# Patient Record
Sex: Male | Born: 1955 | Race: White | Hispanic: No | Smoking: Never smoker
Health system: Southern US, Community
[De-identification: ages and names within clinical notes are randomized; demographics above are authoritative.]

## PROBLEM LIST (undated history)

## (undated) DIAGNOSIS — E785 Hyperlipidemia, unspecified: Secondary | ICD-10-CM

## (undated) HISTORY — DX: Hyperlipidemia, unspecified: E78.5

---

## 2003-04-01 ENCOUNTER — Encounter: Admission: RE | Admit: 2003-04-01 | Discharge: 2003-04-01 | Payer: Self-pay | Admitting: Occupational Medicine

## 2003-04-01 ENCOUNTER — Encounter: Payer: Self-pay | Admitting: Occupational Medicine

## 2003-08-08 ENCOUNTER — Ambulatory Visit (HOSPITAL_BASED_OUTPATIENT_CLINIC_OR_DEPARTMENT_OTHER): Admission: RE | Admit: 2003-08-08 | Discharge: 2003-08-09 | Payer: Self-pay | Admitting: Orthopedic Surgery

## 2004-11-30 ENCOUNTER — Emergency Department (HOSPITAL_COMMUNITY): Admission: EM | Admit: 2004-11-30 | Discharge: 2004-11-30 | Payer: Self-pay | Admitting: Emergency Medicine

## 2006-06-10 ENCOUNTER — Encounter: Payer: Self-pay | Admitting: Internal Medicine

## 2006-06-10 ENCOUNTER — Encounter: Admission: RE | Admit: 2006-06-10 | Discharge: 2006-06-10 | Payer: Self-pay | Admitting: Occupational Medicine

## 2006-12-10 HISTORY — PX: COLONOSCOPY: SHX174

## 2007-09-04 ENCOUNTER — Encounter: Admission: RE | Admit: 2007-09-04 | Discharge: 2007-09-04 | Payer: Self-pay | Admitting: Occupational Medicine

## 2007-12-10 ENCOUNTER — Ambulatory Visit: Payer: Self-pay | Admitting: Internal Medicine

## 2007-12-10 DIAGNOSIS — J069 Acute upper respiratory infection, unspecified: Secondary | ICD-10-CM | POA: Insufficient documentation

## 2008-03-03 ENCOUNTER — Ambulatory Visit: Payer: Self-pay | Admitting: Internal Medicine

## 2008-03-03 LAB — CONVERTED CEMR LAB
ALT: 19 units/L (ref 0–53)
AST: 16 units/L (ref 0–37)
Albumin: 3.9 g/dL (ref 3.5–5.2)
Alkaline Phosphatase: 52 units/L (ref 39–117)
BUN: 24 mg/dL — ABNORMAL HIGH (ref 6–23)
CO2: 29 meq/L (ref 19–32)
Chloride: 106 meq/L (ref 96–112)
Eosinophils Relative: 2.8 % (ref 0.0–5.0)
GFR calc non Af Amer: 83 mL/min
Glucose, Bld: 94 mg/dL (ref 70–99)
HDL: 37.9 mg/dL — ABNORMAL LOW (ref >39.0)
Hemoglobin, Urine: NEGATIVE
Lymphocytes Relative: 28.2 % (ref 12.0–46.0)
Monocytes Relative: 10.8 % (ref 3.0–12.0)
Neutrophils Relative %: 57.7 % (ref 43.0–77.0)
Nitrite: NEGATIVE
Platelets: 224 10*3/uL (ref 150–400)
Potassium: 4.6 meq/L (ref 3.5–5.1)
RBC: 4.89 M/uL (ref 4.22–5.81)
Specific Gravity, Urine: >=1.03 (ref 1.000–1.03)
Total Protein: 6.6 g/dL (ref 6.0–8.3)
Urobilinogen, UA: 0.2 (ref 0.0–1.0)
VLDL: 14 mg/dL (ref 0–40)
WBC: 5.6 10*3/uL (ref 4.5–10.5)

## 2008-03-11 ENCOUNTER — Ambulatory Visit: Payer: Self-pay | Admitting: Internal Medicine

## 2008-03-11 DIAGNOSIS — L255 Unspecified contact dermatitis due to plants, except food: Secondary | ICD-10-CM | POA: Insufficient documentation

## 2008-03-11 DIAGNOSIS — E785 Hyperlipidemia, unspecified: Secondary | ICD-10-CM | POA: Insufficient documentation

## 2008-04-11 ENCOUNTER — Ambulatory Visit: Payer: Self-pay | Admitting: Internal Medicine

## 2008-04-11 LAB — CONVERTED CEMR LAB
Cholesterol: 199 mg/dL (ref 0–200)
LDL Cholesterol: 140 mg/dL — ABNORMAL HIGH (ref 0–99)
VLDL: 23 mg/dL (ref 0–40)

## 2008-09-13 ENCOUNTER — Ambulatory Visit: Payer: Self-pay | Admitting: Internal Medicine

## 2008-09-13 DIAGNOSIS — R1032 Left lower quadrant pain: Secondary | ICD-10-CM | POA: Insufficient documentation

## 2008-09-14 ENCOUNTER — Encounter: Payer: Self-pay | Admitting: Internal Medicine

## 2008-09-14 LAB — CONVERTED CEMR LAB
AST: 21 units/L (ref 0–37)
Alkaline Phosphatase: 48 units/L (ref 39–117)
Basophils Absolute: 0.1 10*3/uL (ref 0.0–0.1)
Chloride: 104 meq/L (ref 96–112)
Eosinophils Absolute: 0.1 10*3/uL (ref 0.0–0.7)
GFR calc non Af Amer: 83 mL/min
Hemoglobin, Urine: NEGATIVE
MCHC: 34.6 g/dL (ref 30.0–36.0)
MCV: 95.7 fL (ref 78.0–100.0)
Neutrophils Relative %: 60.5 % (ref 43.0–77.0)
Platelets: 210 10*3/uL (ref 150–400)
Potassium: 4.2 meq/L (ref 3.5–5.1)
Total Bilirubin: 0.5 mg/dL (ref 0.3–1.2)
Urine Glucose: NEGATIVE mg/dL
Urobilinogen, UA: 0.2 (ref 0.0–1.0)
WBC: 6.7 10*3/uL (ref 4.5–10.5)

## 2008-11-07 ENCOUNTER — Ambulatory Visit: Payer: Self-pay | Admitting: Internal Medicine

## 2009-03-03 ENCOUNTER — Encounter: Payer: Self-pay | Admitting: Internal Medicine

## 2009-03-03 ENCOUNTER — Telehealth: Payer: Self-pay | Admitting: Internal Medicine

## 2009-03-07 ENCOUNTER — Ambulatory Visit: Payer: Self-pay | Admitting: Internal Medicine

## 2009-04-09 ENCOUNTER — Telehealth: Payer: Self-pay | Admitting: Family Medicine

## 2009-04-10 ENCOUNTER — Telehealth: Payer: Self-pay | Admitting: Internal Medicine

## 2010-08-30 ENCOUNTER — Ambulatory Visit: Payer: Self-pay | Admitting: Internal Medicine

## 2010-12-25 NOTE — Assessment & Plan Note (Signed)
Summary: cold/congestion/cough-lb   Vital Signs:  Patient profile:   55 year old male Height:      71 inches Weight:      197 pounds BMI:     27.58 O2 Sat:      98 % on Room air Temp:     96.7 degrees F oral Pulse rate:   72 / minute BP sitting:   112 / 80  (left arm) Cuff size:   large  Vitals Entered By: Bill Salinas CMA (August 30, 2010 9:13 AM)  O2 Flow:  Room air CC: pt here with c/o weakness, fatigue, 1 episode of vomitting,  and diarrhea x 2 days/ ab   Primary Care Provider:  Baley Shands  CC:  pt here with c/o weakness, fatigue, 1 episode of vomitting, and and diarrhea x 2 days/ ab.  History of Present Illness: Patient present with a two day illness that began with N/V, pressure in the head, cough that is non-productive, no SOB, no fever. Having loose stools, twice yesterday. Had some muscle pain. No tinnitis or hearing loss. No chest pain or pressure. No arthralgias.   Current Medications (verified): 1)  Lovastatin 40 Mg  Tabs (Lovastatin) .Marland Kitchen.. 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    Physical Exam  General:  Well-developed,well-nourished,in no acute distress; alert,appropriate and cooperative throughout examination Head:  normocephalic and atraumatic.  No sinus tenderness to percusssion.  Eyes:  PERRLA, C&S clear Ears:  TMs normal Mouth:  no oral lesions, throat clear Neck:  supple and no masses.   Lungs:  normal respiratory effort, normal breath sounds, no crackles, and no wheezes.   Heart:  normal rate and regular rhythm.   Abdomen:  soft, non-tender, and normal bowel sounds.   Msk:  no joint tenderness, no joint swelling, and no joint warmth.   Pulses:  2+ radial Neurologic:  alert & oriented X3 and cranial nerves II-XII intact.  abnormal gait with abnormal movement right leg. Skin:  turgor normal, color normal, and no rashes.   Cervical Nodes:  no anterior cervical adenopathy and no posterior cervical adenopathy.   Psych:  normally interactive and good eye contact.     Impression & Recommendations:  Problem # 1:  VIRAL INFECTION (ICD-079.99) Patient with non-focal exam, no evidence of a bacterial infection.  Plan - supportive care: APAP, hydrate, Vit C, ecchinacea            written out of work to Monday, Oct 10th  Complete Medication List: 1)  Lovastatin 40 Mg Tabs (Lovastatin) .Marland Kitchen.. 1 by mouth qpm   Preventive Care Screening  Last  Tetanus Booster:    Date:  01/27/2008    Results:  Historical

## 2011-04-12 NOTE — Op Note (Signed)
NAME:  Leonard Soto, Leonard Soto                            ACCOUNT NO.:  [id number]   MEDICAL RECORD NO.:  [id number]                   PATIENT TYPE:  AMB   LOCATION:  [facility]SC                                  FACILITY:  [facility]   PHYSICIAN:  Leonides Grills, M.D.                  DATE OF BIRTH:  05/08/1956   DATE OF PROCEDURE:  08/08/2003  DATE OF DISCHARGE:                                 OPERATIVE REPORT   PREOPERATIVE DIAGNOSIS:  1. Right ankle arthritis.  2. Right posterior ankle contracture.  3. Right tight Achilles tendon.  4. Malposition valgus hindfoot.   POSTOPERATIVE DIAGNOSIS:  1. Right ankle arthritis.  2. Right posterior ankle contracture.  3. Right tight Achilles tendon.  4. Malposition valgus hind food.   OPERATION PERFORMED:  1. Right ankle fusion.  2. Right local bone graft.  3. Right posterior ankle capsular release.  4. Right percutaneous tendo-Achilles lengthening.  5. Right medializing calcaneal osteotomy.  6. Right stress x-rays of ankle.   SURGEON:  Leonides Grills, M.D.   ASSISTANT:  Lianne Cure, P.A.   ANESTHESIA:  General endotracheal tube.   ESTIMATED BLOOD LOSS:  Minimal.   TOURNIQUET TIME:  Approximately two hours.   COMPLICATIONS:  None.   DISPOSITION:  Stable to PR.   INDICATIONS FOR PROCEDURE:  The patient is a 55 year old male who had a foot  drop and was noncompliant with his brace and developed a contracture,  equinus valgus contracture interestingly.  He also had pain on the anterior  aspect of his ankle as well as his forefoot.  He had the option of either a  soft tissue release and wearing his brace or an ankle fusion at this point.  He did not have any adequate musculature for transfer.  He elected to have  the ankle fusion for by his own admission, he would not be compliant with  the brace.  He was consented for the above procedure.  All risks which  include infection, neurovascular injury, nonunion, malunion, hardware  irritation, hardware  failure, persistent pain, worsening pain, stiffness,  arthritis were all explained, questions were encouraged and answered.   DESCRIPTION OF PROCEDURE:  The patient was brought to the operating room and  placed in supine position initially after adequate general endotracheal tube  anesthesia was administered as well as Ancef 1g IV piggyback.  A bump was  placed under the right ipsilateral hip and the right lower extremity was  prepped and draped in a sterile manner over a proximally placed thigh  tourniquet.  The limb was gravity exsanguinated and the tourniquet was  elevated to 290 mmHg after two medial and one lateral hemisections of the  Achilles was then performed.  This had somewhat good release of the tight  Achilles tendon.  We then started the procedure with a longitudinal incision  over the lateral malleolus.  Dissection was carried down  directly to bone.  Soft tissues were elevated anteriorly.  Approximately 5 cm proximal to the  tip of the lateral malleolus, a 1 cm block of bone was then removed from the  fibula doing a fibular osteotomy.  The syndesmotic ligament was then incised  and completely released and the fibula was then externally rotated.  The  remaining cartilage was then removed with a curved quarter inch osteotome,  curet involving the ankle.  This was done meticulously.  Then multiple 2 mm  drill holes were then placed on either side of the joint as well as  laterally along the tibia as well.  Then with a sagittal saw, the medial  third of the fibula was then removed in the sagittal plane.  This was then  used for local bone graft.  After this was done, we then released the  posterior capsule with a curved quarter inch osteotome as well as a scalpel  by visualizing this with a lamina spreader stretching the capsule and  cutting this under tension.  We then dorsiflexed and posteriorly translated  the foot within the ankle mortise and provisionally fixed this with 2.0  mm K-  wires.  This was visualized on stress x-ray under a C-arm guidance in the AP  and lateral plane.  The foot was in excellent alignment as well anatomically  with proper external rotation as well.  The hind foot was still in valgus,  but there was no erosion within the plafond to try to correct the valgus as  well at this point.  It was elected that we would do the medializing  calcaneal osteotomy as well.  The ankle was then fixed with two laterally  and medially placed 6.5 mm 16 mm threaded cancellous screws with the medial  one done through a percutaneous hole with a nick and spread technique.  This  was visualized under C-arm guidance in the AP and lateral planes to be in a  proper position.  The wound was copiously irrigated with normal saline.  A 2  point reduction clamp was used to reduce the biological plate laterally and  then two 6.5 mm 32 mm threaded cancellous screws were then placed in a lag  fashion.  This had excellent compression across the fusion site.  Local bone  graft that was used previously was then packed into the area with the bur,  stress strain relieving bone graft was placed as well.  X-rays were obtained  in three views, AP, lateral and mortise view showed excellent alignment and  placement of fixation.  We then made a longitudinal incision over the  lateral aspect of the calcaneus.  Dissection was carried down to bone.  Soft  tissues were elevated both superiorly and inferiorly and then with a 4000  saw, an osteotomy was then made perpendicular to the lateral wall of the  calcaneus. The calcaneus was then translated about 5 mm medially and then  provisionally fixed with a 2.0 mm K-wire.  A longitudinal incision was then  made midline, the sagittal plane over the heel at the __________ skin  border.  Dissection was carried down to bone.  Two 6.5 mm 16 mm threaded cancellous screws were then placed using 4.5 3.5 mm drill holes  respectively.  This had  excellent compression of the osteotomy and  maintenance of the correction.  The wounds were then copiously irrigated  with normal saline.  Tourniquet was deflated.  Hemostasis was obtained.  The  wound was closed  with 3-0 Vicryl and 4-0 nylon respectively.  Sterile  dressing was applied.  Modified Jones dressing was applied.  The patient was  stable to the PR.                                                   Leonides Grills, M.D.    PB/MEDQ  D:  08/09/2003  T:  08/09/2003  Job:  [id number]

## 2012-08-18 ENCOUNTER — Ambulatory Visit (INDEPENDENT_AMBULATORY_CARE_PROVIDER_SITE_OTHER): Payer: BC Managed Care – PPO | Admitting: Internal Medicine

## 2012-08-18 ENCOUNTER — Encounter: Payer: Self-pay | Admitting: Internal Medicine

## 2012-08-18 ENCOUNTER — Ambulatory Visit (INDEPENDENT_AMBULATORY_CARE_PROVIDER_SITE_OTHER)
Admission: RE | Admit: 2012-08-18 | Discharge: 2012-08-18 | Disposition: A | Discharge status: Home / Self Care | Payer: BC Managed Care – PPO | Source: Ambulatory Visit | Attending: Internal Medicine | Admitting: Internal Medicine

## 2012-08-18 ENCOUNTER — Telehealth: Payer: Self-pay | Admitting: Internal Medicine

## 2012-08-18 VITALS — BP 122/80 | HR 68 | Temp 97.3°F | Resp 16 | Wt 197.0 lb

## 2012-08-18 DIAGNOSIS — M545 Low back pain, unspecified: Secondary | ICD-10-CM

## 2012-08-18 MED ORDER — CYCLOBENZAPRINE HCL 10 MG PO TABS: 10.0000 mg | ORAL_TABLET | Freq: Three times a day (TID) | ORAL | Status: DC

## 2012-08-18 NOTE — Progress Notes (Signed)
  Subjective:    Patient ID: Leonard Soto, male    DOB: [birth year], 56 y.o.   MRN: [id number]  HPI Leonard Soto presents for evaluation after a fall at work. His left leg gave away and he fell. He did not strike anyting and he did not pass out. He developed right of center back pain. Has had minor paresthesia right leg, no loss of strength. No problem with bowel or bladder control. Able to stand and walk as his usual - uses a cane.        Current Outpatient Prescriptions on File Prior to Visit  Medication Sig Dispense Refill  . lovastatin (MEVACOR) 40 MG tablet Take 40 mg by mouth at bedtime.          Review of Systems System review is negative for any constitutional, cardiac, pulmonary, GI or neuro symptoms or complaints other than as described in the HPI.     Objective:   Physical Exam Filed Vitals:   08/18/12 1110  BP: 122/80  Pulse: 68  Temp: 97.3 F (36.3 C)  Resp: 16   Gen'l - WNWD white male in no distress HEENT- C&S clear - PERRLA Cor - RRR Pulm - clear to A&P Back - Back exam: limited stand due to chronic leg issues; normal flex to greater than 70 degrees; abnormal gait - chronic; cannot  toe/heel walk; abnormal step up to exam table but able to get up w/o assistance; normal SLR sitting; normal DTRs at the patellar tendons;  no  CVA tenderness..        Assessment & Plan:  Back strain - no evidence of radiculopathy. Suspect muscle strain from torquing with fall.  Plan  L-S spine films  Lineament of choice  Heat patch  No work or strain for 3-5 days.

## 2012-08-18 NOTE — Telephone Encounter (Signed)
Caller: Wilburt/Patient; Patient Name: Leonard Soto; PCP: Illene Regulus (Adults only); Best Callback Phone Number: [phone]. Emmette calling for an appointment with PCP this am for back pain. Caller reports he was sick yesterday and had some coughing and then developed back pain. Onset of symptoms, Monday 08/17/12. Caller has history of back pain. Declined triage, requesting appointment. Emergent symptoms ruled out per Back  Symptoms Protocol. Appointment scheduled for today, Tues 9/24 at 11:00 with Dr Debby Bud at [place] location. Caller is agreeable.

## 2012-08-18 NOTE — Patient Instructions (Addendum)
Back strain - no evidence of radiculopathy. Suspect muscle strain from torquing with fall.  Plan  L-S spine films  Lineament of choice  Heat patch  No work or strain for 3-5 days.   Back Pain, Adult Low back pain is very common. About 1 in 5 people have back pain. The cause of low back pain is rarely dangerous. The pain often gets better over time. About half of people with a sudden onset of back pain feel better in just 2 weeks. About 8 in 10 people feel better by 6 weeks.   CAUSES Some common causes of back pain include:  Strain of the muscles or ligaments supporting the spine.   Wear and tear (degeneration) of the spinal discs.   Arthritis.   Direct injury to the back.  DIAGNOSIS Most of the time, the direct cause of low back pain is not known. However, back pain can be treated effectively even when the exact cause of the pain is unknown. Answering your caregiver's questions about your overall health and symptoms is one of the most accurate ways to make sure the cause of your pain is not dangerous. If your caregiver needs more information, he or she may order lab work or imaging tests (X-rays or MRIs). However, even if imaging tests show changes in your back, this usually does not require surgery. HOME CARE INSTRUCTIONS For many people, back pain returns. Since low back pain is rarely dangerous, it is often a condition that people can learn to manage on their own.    Remain active. It is stressful on the back to sit or stand in one place. Do not sit, drive, or stand in one place for more than 30 minutes at a time. Take short walks on level surfaces as soon as pain allows. Try to increase the length of time you walk each day.   Do not stay in bed. Resting more than 1 or 2 days can delay your recovery.   Do not avoid exercise or work. Your body is made to move. It is not dangerous to be active, even though your back may hurt. Your back will likely heal faster if you return to being  active before your pain is gone.   Pay attention to your body when you  bend and lift. Many people have less discomfort when lifting if they bend their knees, keep the load close to their bodies, and avoid twisting. Often, the most comfortable positions are those that put less stress on your recovering back.   Find a comfortable position to sleep. Use a firm mattress and lie on your side with your knees slightly bent. If you lie on your back, put a pillow under your knees.   Only take over-the-counter or prescription medicines as directed by your caregiver. Over-the-counter medicines to reduce pain and inflammation are often the most helpful. Your caregiver may prescribe muscle relaxant drugs. These medicines help dull your pain so you can more quickly return to your normal activities and healthy exercise.   Put ice on the injured area.   Put ice in a plastic bag.   Place a towel between your skin and the bag.   Leave the ice on for 15 to 20 minutes, 3 to 4 times a day for the first 2 to 3 days. After that, ice and heat may be alternated to reduce pain and spasms.   Ask your caregiver about trying back exercises and gentle massage. This may be of some benefit.  Avoid feeling anxious or stressed. Stress increases muscle tension and can worsen back pain. It is important to recognize when you are anxious or stressed and learn ways to manage it. Exercise is a great option.  SEEK MEDICAL CARE IF:  You have pain that is not relieved with rest or medicine.   You have pain that does not improve in 1 week.   You have new symptoms.   You are generally not feeling well.  SEEK IMMEDIATE MEDICAL CARE IF:    You have pain that radiates from your back into your legs.   You develop new bowel or bladder control problems.   You have unusual weakness or numbness in your arms or legs.   You develop nausea or vomiting.   You develop abdominal pain.   You feel faint.  Document Released:  11/11/2005 Document Revised: 10/31/2011 Document Reviewed: 04/01/2011 [facility] Patient Information 2012 [place], [place].

## 2012-11-02 ENCOUNTER — Telehealth: Payer: Self-pay | Admitting: Internal Medicine

## 2012-11-02 NOTE — Telephone Encounter (Signed)
I apologize for the delay with this msg....  Called patient, spoke with his wife.  She is going to have the patient call at his earliest convienence to schedule his appointment.  If the call is not returned, I will call pt back.

## 2012-11-02 NOTE — Telephone Encounter (Signed)
Message copied by Newell Coral on Mon Nov 02, 2012 10:47 AM ------      Message from: Illene Regulus E      Created: Mon Oct 26, 2012  9:24 AM       Needs CPX in the next 8-12 weeks. Received outside labs and will need to discuss at his CPX

## 2013-01-09 ENCOUNTER — Other Ambulatory Visit: Payer: Self-pay

## 2013-09-30 ENCOUNTER — Other Ambulatory Visit: Payer: Self-pay

## 2013-12-16 ENCOUNTER — Ambulatory Visit (INDEPENDENT_AMBULATORY_CARE_PROVIDER_SITE_OTHER): Payer: BC Managed Care – PPO | Admitting: Internal Medicine

## 2013-12-16 ENCOUNTER — Encounter: Payer: Self-pay | Admitting: Internal Medicine

## 2013-12-16 VITALS — BP 142/104 | HR 65 | Temp 98.0°F | Wt 193.0 lb

## 2013-12-16 DIAGNOSIS — H10029 Other mucopurulent conjunctivitis, unspecified eye: Secondary | ICD-10-CM

## 2013-12-16 DIAGNOSIS — B029 Zoster without complications: Secondary | ICD-10-CM

## 2013-12-16 MED ORDER — SULFACETAMIDE SODIUM 10 % OP SOLN: 1.0000 [drp] | OPHTHALMIC | Status: DC

## 2013-12-16 MED ORDER — PREDNISONE 20 MG PO TABS: 20.0000 mg | ORAL_TABLET | Freq: Every day | ORAL | Status: DC

## 2013-12-16 MED ORDER — VALACYCLOVIR HCL 1 G PO TABS: 1000.0000 mg | ORAL_TABLET | Freq: Three times a day (TID) | ORAL | Status: DC

## 2013-12-16 NOTE — Progress Notes (Signed)
   Subjective:    Patient ID: Leonard Soto, male    DOB: [birth year], 58 y.o.   MRN: [id number]  HPI Monday he awoke with swollen face, couldn't breath, very painful jaw and swelling around the eye, He went to the dentist - had to stand out in the cold for a while waiting for the office to open. He had the left upper premolar pulled and he was started on amoxicillin and pain medication. Tuesday morning he awoke sick - N/V. Saw the dentist and was told to stop taking the hydrocodone. This AM he went to work and noted that he had blurred vision out of the left eye. He saw the company nurse - he had a rash on the left forehead/temple diagnosed as zoster and question of ophthalmologic involvement.  Past Medical History  Diagnosis Date  . Hyperlipidemia    Past Surgical History  Procedure Laterality Date  . Colonoscopy  12/10/2006   Family History  Problem Relation Age of Onset  . Hypertension Mother   . COPD Mother   . CVA Mother   . Hypertension Father   . Hyperlipidemia Father   . Benign prostatic hyperplasia Father    History   Social History  . Marital Status: Married    Spouse Name: N/A    Number of Children: N/A  . Years of Education: N/A   Occupational History  . Not on file.   Social History Main Topics  . Smoking status: Never Smoker   . Smokeless tobacco: Not on file  . Alcohol Use: No  . Drug Use: No  . Sexual Activity: Not on file   Other Topics Concern  . 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   No current facility-administered medications on file prior to visit.      Review of Systems System review is negative for any constitutional, cardiac, pulmonary, GI or neuro symptoms or complaints other than  as described in the HPI.    Objective:   Physical Exam Filed Vitals:   12/16/13 1602  BP: 142/104  Pulse: 65  Temp: 98 F (36.7 C)   gen'l - WNWD man in no distress HEENT- - bulbar conjunctiva appear injected. No papules near the eye or on the conjunctiva. Lens appears clear. Pupil is round and reactive. Vision normal Nodes - normal Cor - RRR Pulm - CTAP        Assessment & Plan:  1. Zoster - forehead., No apparent involvement of the eye. Plan Valtrex 1g tid x 7  Prednisone 20 daily x 10 days  2. Pink eye - he has had thick exudate and erythema. Plan Sulfacetamide ophthalmic sol 2 drops 5 times a day for 7 days.  Alarm symptoms - loss of vision, pain in the eye - will need to see eye Dr.   3. Dental - finish the amoxicillin.

## 2013-12-16 NOTE — Progress Notes (Signed)
Pre visit review using our clinic review tool, if applicable. No additional management support is needed unless otherwise documented below in the visit note. 

## 2013-12-16 NOTE — Patient Instructions (Signed)
1. Zoster - forehead., No apparent involvement of the eye. Plan Valtrex 1g tid x 7  Prednisone 20 daily x 10 days  2. Pink eye - he has had thick exudate and erythema. Plan Sulfacetamide ophthalmic sol 2 drops 5 times a day for 7 days.  Alarm symptoms - loss of vision, pain in the eye - will need to see eye Dr.   3. Dental - finish the amoxicillin.  Shingles Shingles (herpes zoster) is an infection that is caused by the same virus that causes chickenpox (varicella). The infection causes a painful skin rash and fluid-filled blisters, which eventually break open, crust over, and heal. It may occur in any area of the body, but it usually affects only one side of the body or face. The pain of shingles usually lasts about 1 month. However, some people with shingles may develop long-term (chronic) pain in the affected area of the body. Shingles often occurs many years after the person had chickenpox. It is more common:  In people older than 50 years.  In people with weakened immune systems, such as those with HIV, AIDS, or cancer.  In people taking medicines that weaken the immune system, such as transplant medicines.  In people under great stress. CAUSES  Shingles is caused by the varicella zoster virus (VZV), which also causes chickenpox. After a person is infected with the virus, it can remain in the person's body for years in an inactive state (dormant). To cause shingles, the virus reactivates and breaks out as an infection in a nerve root. The virus can be spread from person to person (contagious) through contact with open blisters of the shingles rash. It will only spread to people who have not had chickenpox. When these people are exposed to the virus, they may develop chickenpox. They will not develop shingles. Once the blisters scab over, the person is no longer contagious and cannot spread the virus to others. SYMPTOMS  Shingles shows up in stages. The initial symptoms may be pain,  itching, and tingling in an area of the skin. This pain is usually described as burning, stabbing, or throbbing.In a few days or weeks, a painful red rash will appear in the area where the pain, itching, and tingling were felt. The rash is usually on one side of the body in a band or belt-like pattern. Then, the rash usually turns into fluid-filled blisters. They will scab over and dry up in approximately 2 3 weeks. Flu-like symptoms may also occur with the initial symptoms, the rash, or the blisters. These may include:  Fever.  Chills.  Headache.  Upset stomach. DIAGNOSIS  Your caregiver will perform a skin exam to diagnose shingles. Skin scrapings or fluid samples may also be taken from the blisters. This sample will be examined under a microscope or sent to a lab for further testing. TREATMENT  There is no specific cure for shingles. Your caregiver will likely prescribe medicines to help you manage the pain, recover faster, and avoid long-term problems. This may include antiviral drugs, anti-inflammatory drugs, and pain medicines. HOME CARE INSTRUCTIONS   Take a cool bath or apply cool compresses to the area of the rash or blisters as directed. This may help with the pain and itching.   Only take over-the-counter or prescription medicines as directed by your caregiver.   Rest as directed by your caregiver.  Keep your rash and blisters clean with mild soap and cool water or as directed by your caregiver.  Do not  pick your blisters or scratch your rash. Apply an anti-itch cream or numbing creams to the affected area as directed by your caregiver.  Keep your shingles rash covered with a loose bandage (dressing).  Avoid skin contact with:  Babies.   Pregnant women.   Children with eczema.   Elderly people with transplants.   People with chronic illnesses, such as leukemia or AIDS.   Wear loose-fitting clothing to help ease the pain of material rubbing against the  rash.  Keep all follow-up appointments with your caregiver.If the area involved is on your face, you may receive a referral for follow-up to a specialist, such as an eye doctor (ophthalmologist) or an ear, nose, and throat (ENT) doctor. Keeping all follow-up appointments will help you avoid eye complications, chronic pain, or disability.  SEEK IMMEDIATE MEDICAL CARE IF:   You have facial pain, pain around the eye area, or loss of feeling on one side of your face.  You have ear pain or ringing in your ear.  You have loss of taste.  Your pain is not relieved with prescribed medicines.   Your redness or swelling spreads.   You have more pain and swelling.  Your condition is worsening or has changed.   You have a feveror persistent symptoms for more than 2 3 days.  You have a fever and your symptoms suddenly get worse. MAKE SURE YOU:  Understand these instructions.  Will watch your condition.  Will get help right away if you are not doing well or get worse. Document Released: 11/11/2005 Document Revised: 08/05/2012 Document Reviewed: 06/25/2012 [facility] Patient Information 2014 [place], [place].    Bacterial Conjunctivitis Bacterial conjunctivitis, commonly called pink eye, is an inflammation of the clear membrane that covers the white part of the eye (conjunctiva). The inflammation can also happen on the underside of the eyelids. The blood vessels in the conjunctiva become inflamed causing the eye to become red or pink. Bacterial conjunctivitis may spread easily from one eye to another and from person to person (contagious).  CAUSES  Bacterial conjunctivitis is caused by bacteria. The bacteria may come from your own skin, your upper respiratory tract, or from someone else with bacterial conjunctivitis. SYMPTOMS  The normally white color of the eye or the underside of the eyelid is usually pink or red. The pink eye is usually associated with irritation, tearing, and some  sensitivity to light. Bacterial conjunctivitis is often associated with a thick, yellowish discharge from the eye. The discharge may turn into a crust on the eyelids overnight, which causes your eyelids to stick together. If a discharge is present, there may also be some blurred vision in the affected eye. DIAGNOSIS  Bacterial conjunctivitis is diagnosed by your caregiver through an eye exam and the symptoms that you report. Your caregiver looks for changes in the surface tissues of your eyes, which may point to the specific type of conjunctivitis. A sample of any discharge may be collected on a cotton-tip swab if you have a severe case of conjunctivitis, if your cornea is affected, or if you keep getting repeat infections that do not respond to treatment. The sample will be sent to a lab to see if the inflammation is caused by a bacterial infection and to see if the infection will respond to antibiotic medicines. TREATMENT   Bacterial conjunctivitis is treated with antibiotics. Antibiotic eyedrops are most often used. However, antibiotic ointments are also available. Antibiotics pills are sometimes used. Artificial tears or eye washes may ease  discomfort. HOME CARE INSTRUCTIONS   To ease discomfort, apply a cool, clean wash cloth to your eye for 10 20 minutes, 3 4 times a day.  Gently wipe away any drainage from your eye with a warm, wet washcloth or a cotton ball.  Wash your hands often with soap and water. Use paper towels to dry your hands.  Do not share towels or wash cloths. This may spread the infection.  Change or wash your pillow case every day.  You should not use eye makeup until the infection is gone.  Do not operate machinery or drive if your vision is blurred.  Stop using contacts lenses. Ask your caregiver how to sterilize or replace your contacts before using them again. This depends on the type of contact lenses that you use.  When applying medicine to the infected eye, do  not touch the edge of your eyelid with the eyedrop bottle or ointment tube. SEEK IMMEDIATE MEDICAL CARE IF:   Your infection has not improved within 3 days after beginning treatment.  You had yellow discharge from your eye and it returns.  You have increased eye pain.  Your eye redness is spreading.  Your vision becomes blurred.  You have a fever or persistent symptoms for more than 2 3 days.  You have a fever and your symptoms suddenly get worse.  You have facial pain, redness, or swelling. MAKE SURE YOU:   Understand these instructions.  Will watch your condition.  Will get help right away if you are not doing well or get worse. Document Released: 11/11/2005 Document Revised: 08/05/2012 Document Reviewed: 04/13/2012 [facility] Patient Information 2014 [place].

## 2014-01-07 ENCOUNTER — Encounter (INDEPENDENT_AMBULATORY_CARE_PROVIDER_SITE_OTHER): Payer: BC Managed Care – PPO | Admitting: Ophthalmology

## 2014-01-07 DIAGNOSIS — H538 Other visual disturbances: Secondary | ICD-10-CM

## 2014-01-07 DIAGNOSIS — H1820 Unspecified corneal edema: Secondary | ICD-10-CM

## 2014-01-07 DIAGNOSIS — H43819 Vitreous degeneration, unspecified eye: Secondary | ICD-10-CM

## 2014-06-13 ENCOUNTER — Telehealth: Payer: Self-pay | Admitting: Internal Medicine

## 2014-06-13 NOTE — Telephone Encounter (Signed)
Ok with me 

## 2014-06-13 NOTE — Telephone Encounter (Signed)
Pt call back and stated that he going to stay with Dr. Dorise HissKollar. Cancel this request

## 2014-06-13 NOTE — Telephone Encounter (Signed)
[facility] patient would like to see if Dr. Jonny RuizJohn would be able to take him on.

## 2014-08-22 ENCOUNTER — Other Ambulatory Visit (INDEPENDENT_AMBULATORY_CARE_PROVIDER_SITE_OTHER): Payer: BC Managed Care – PPO

## 2014-08-22 ENCOUNTER — Ambulatory Visit (INDEPENDENT_AMBULATORY_CARE_PROVIDER_SITE_OTHER): Payer: BC Managed Care – PPO | Admitting: Internal Medicine

## 2014-08-22 ENCOUNTER — Encounter: Payer: Self-pay | Admitting: Internal Medicine

## 2014-08-22 VITALS — BP 140/84 | HR 59 | Temp 97.8°F | Resp 18 | Ht 71.0 in | Wt 194.0 lb

## 2014-08-22 DIAGNOSIS — Z0000 Encounter for general adult medical examination without abnormal findings: Secondary | ICD-10-CM

## 2014-08-22 DIAGNOSIS — Z23 Encounter for immunization: Secondary | ICD-10-CM

## 2014-08-22 DIAGNOSIS — E669 Obesity, unspecified: Secondary | ICD-10-CM

## 2014-08-22 DIAGNOSIS — E785 Hyperlipidemia, unspecified: Secondary | ICD-10-CM | POA: Diagnosis not present

## 2014-08-22 LAB — BASIC METABOLIC PANEL
BUN: 14 mg/dL (ref 6–23)
CHLORIDE: 103 meq/L (ref 96–112)
CO2: 28 mEq/L (ref 19–32)
Calcium: 9.3 mg/dL (ref 8.4–10.5)
Creatinine, Ser: 1 mg/dL (ref 0.4–1.5)
GFR: 81.98 mL/min (ref >60.00)
Glucose, Bld: 92 mg/dL (ref 70–99)
POTASSIUM: 4 meq/L (ref 3.5–5.1)
SODIUM: 138 meq/L (ref 135–145)

## 2014-08-22 LAB — LIPID PANEL
CHOLESTEROL: 177 mg/dL (ref 0–200)
HDL: 49.6 mg/dL (ref >39.00)
LDL CALC: 104 mg/dL — AB (ref 0–99)
NonHDL: 127.4
TRIGLYCERIDES: 117 mg/dL (ref 0.0–149.0)
Total CHOL/HDL Ratio: 4
VLDL: 23.4 mg/dL (ref 0.0–40.0)

## 2014-08-22 NOTE — Progress Notes (Signed)
Pre visit review using our clinic review tool, if applicable. No additional management support is needed unless otherwise documented below in the visit note. 

## 2014-08-22 NOTE — Patient Instructions (Signed)
We will check your blood work today and call you with the results. We will not change your medicines.  You are doing a great job with the weight loss and keep up the good work.  Exercise to Stay Healthy Exercise helps you become and stay healthy. EXERCISE IDEAS AND TIPS Choose exercises that:  You enjoy.  Fit into your day. You do not need to exercise really hard to be healthy. You can do exercises at a slow or medium level and stay healthy. You can:  Stretch before and after working out.  Try yoga, Pilates, or tai chi.  Lift weights.  Walk fast, swim, jog, run, climb stairs, bicycle, dance, or rollerskate.  Take aerobic classes. Exercises that burn about 150 calories:  Running 1  miles in 15 minutes.  Playing volleyball for 45 to 60 minutes.  Washing and waxing a car for 45 to 60 minutes.  Playing touch football for 45 minutes.  Walking 1  miles in 35 minutes.  Pushing a stroller 1  miles in 30 minutes.  Playing basketball for 30 minutes.  Raking leaves for 30 minutes.  Bicycling 5 miles in 30 minutes.  Walking 2 miles in 30 minutes.  Dancing for 30 minutes.  Shoveling snow for 15 minutes.  Swimming laps for 20 minutes.  Walking up stairs for 15 minutes.  Bicycling 4 miles in 15 minutes.  Gardening for 30 to 45 minutes.  Jumping rope for 15 minutes.  Washing windows or floors for 45 to 60 minutes. Document Released: 12/14/2010 Document Revised: 02/03/2012 Document Reviewed: 12/14/2010 [facility] Patient Information 2015 [place], [place]. This information is not intended to replace advice given to you by your health care provider. Make sure you discuss any questions you have with your health care provider.

## 2014-08-23 DIAGNOSIS — Z0000 Encounter for general adult medical examination without abnormal findings: Secondary | ICD-10-CM | POA: Insufficient documentation

## 2014-08-23 DIAGNOSIS — E669 Obesity, unspecified: Secondary | ICD-10-CM | POA: Insufficient documentation

## 2014-08-23 NOTE — Assessment & Plan Note (Signed)
Flu shot given today. Advised more moderation in eating choices and if he continues to have loose stools to call back.

## 2014-08-23 NOTE — Assessment & Plan Note (Signed)
He is working on weight loss and has been losing some weight. Encouraged him to continue.

## 2014-08-23 NOTE — Assessment & Plan Note (Signed)
Continue lipitor and check lipid panel and BMP.

## 2014-08-23 NOTE — Progress Notes (Signed)
   Subjective:    Patient ID: Leonard Soto, male    DOB: 1956-11-15, 58 y.o.   MRN: [id number]  HPI The patient is a 58 YO man who is coming in today to establish care. He has PMH of hyperlipidemia. He ate a lot of beans yesterday and has been moving his bowels several times today which is more than usual. He took some pepto which has helped to slow things down. Otherwise no complaints.   Review of Systems  Constitutional: Negative for fever, activity change, appetite change and fatigue.  HENT: Negative.   Eyes: Negative.   Respiratory: Negative for cough, chest tightness, shortness of breath and wheezing.   Cardiovascular: Negative for chest pain, palpitations and leg swelling.  Gastrointestinal: Positive for diarrhea. Negative for abdominal pain, constipation and abdominal distention.  Endocrine: Negative.   Genitourinary: Negative.   Musculoskeletal: Negative.   Neurological: Negative.       Objective:   Physical Exam  Vitals reviewed. Constitutional: He is oriented to person, place, and time. He appears well-developed and well-nourished. No distress.  HENT:  Head: Normocephalic and atraumatic.  Eyes: EOM are normal.  Neck: Normal range of motion.  Cardiovascular: Normal rate and regular rhythm.   Pulmonary/Chest: Effort normal and breath sounds normal. No respiratory distress. He has no wheezes. He has no rales.  Abdominal: Soft. Bowel sounds are normal. He exhibits no distension. There is no tenderness. There is no rebound.  Neurological: He is alert and oriented to person, place, and time. Coordination normal.  Skin: Skin is warm and dry.   Filed Vitals:   08/22/14 1458  BP: 140/84  Pulse: 59  Temp: 97.8 F (36.6 C)  TempSrc: Oral  Resp: 18  Height: 5\' 11"  (1.803 m)  Weight: 194 lb (87.998 kg)  SpO2: 97%      Assessment & Plan:

## 2014-10-11 ENCOUNTER — Telehealth: Payer: Self-pay | Admitting: *Deleted

## 2014-10-11 NOTE — Telephone Encounter (Signed)
Left msg on triage wanting to get copy of labs. Called pt back spoke with wife she stated [employer] is wanting  Labs info & weight ect. Inform pt wife will print copy of labs & ov from 08/22/14 for pick-up,,,/lmb

## 2015-02-22 ENCOUNTER — Other Ambulatory Visit: Payer: Self-pay | Admitting: Geriatric Medicine

## 2015-02-22 MED ORDER — ATORVASTATIN CALCIUM 20 MG PO TABS: 20.0000 mg | ORAL_TABLET | Freq: Every day | ORAL | Status: DC

## 2015-02-27 ENCOUNTER — Other Ambulatory Visit: Payer: Self-pay | Admitting: Geriatric Medicine

## 2015-02-27 ENCOUNTER — Telehealth: Payer: Self-pay | Admitting: Internal Medicine

## 2015-02-27 MED ORDER — ATORVASTATIN CALCIUM 20 MG PO TABS: 20.0000 mg | ORAL_TABLET | Freq: Every day | ORAL | Status: DC

## 2015-02-27 NOTE — Telephone Encounter (Signed)
Sent to pharmacy 

## 2015-02-27 NOTE — Telephone Encounter (Signed)
[facility] is waiting for approval from Dr. For atorvastatin (LIPITOR) 20 MG tablet [[id number]

## 2015-03-10 ENCOUNTER — Ambulatory Visit (INDEPENDENT_AMBULATORY_CARE_PROVIDER_SITE_OTHER): Payer: BLUE CROSS/BLUE SHIELD | Admitting: Internal Medicine

## 2015-03-10 ENCOUNTER — Encounter: Payer: Self-pay | Admitting: Internal Medicine

## 2015-03-10 VITALS — BP 126/86 | HR 71 | Temp 98.0°F | Ht 71.0 in | Wt 196.2 lb

## 2015-03-10 DIAGNOSIS — R05 Cough: Secondary | ICD-10-CM | POA: Diagnosis not present

## 2015-03-10 DIAGNOSIS — R5381 Other malaise: Secondary | ICD-10-CM | POA: Diagnosis not present

## 2015-03-10 DIAGNOSIS — R059 Cough, unspecified: Secondary | ICD-10-CM

## 2015-03-10 MED ORDER — HYDROCODONE-HOMATROPINE 5-1.5 MG/5ML PO SYRP: 5.0000 mL | ORAL_SOLUTION | Freq: Three times a day (TID) | ORAL | Status: DC | PRN

## 2015-03-10 MED ORDER — AZITHROMYCIN 250 MG PO TABS: ORAL_TABLET | ORAL | Status: DC

## 2015-03-10 NOTE — Progress Notes (Signed)
Pre visit review using our clinic review tool, if applicable. No additional management support is needed unless otherwise documented below in the visit note. 

## 2015-03-10 NOTE — Progress Notes (Signed)
   Subjective:    Patient ID: Leonard Soto, male    DOB: 1956/09/01, 59 y.o.   MRN: [id number]  HPI  His symptoms began 03/08/15 as malaise associated with some nonproductive cough. On 4/14 his symptoms improved but last night the cough returned. Today he felt mentally "foggy" and continued to have malaise. He has had no other localizing signs or symptoms. He did not feel he should work as his job does require mental alertness. 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  He did have the flu shot  Review of Systems  Frontal headache, facial pain , nasal purulence, dental pain, sore throat , otic pain or otic discharge denied.  Sputum production, hemoptysis, or pleuritic pain denied.  No diarrhea present.Cola colored urine or clay colored stools denied.  Dysuria, pyuria, or hematuria not present.  No new rashes, pustules, vesicles.  No redness or swelling of joints.  No significant travel, pets, or tick exposures.      Objective:   Physical Exam  Pertinent or positive findings include: Pattern alopecia is present.  The nasal septum is deviated to the right.  Loud S4 is present.  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  He has no evidence of meningismus with passive or active neck range of motion and straight leg raising of the lower extremity.  He has scattered tattoos.   General appearance :adequately nourished; in no distress. Eyes: No conjunctival inflammation or scleral icterus is present. Oral exam:  Lips and gums are healthy appearing.There is no oropharyngeal erythema or exudate noted. Dental hygiene is good. Heart:  Normal rate and regular rhythm. S1 and S2 normal without gallop, murmur, click,or rub.   Lungs:Chest clear to auscultation; no wheezes, rhonchi,rales ,or rubs present.No increased work of breathing.  Abdomen: bowel sounds normal, soft and non-tender without masses, organomegaly or hernias noted.  No guarding or rebound.  Vascular : all pulses  equal ; no bruits present. Skin:Warm & dry.  Intact without suspicious lesions or rashes ; no tenting or jaundice  Lymphatic: No lymphadenopathy is noted about the head, neck, axilla Neuro: Strength, tone  normal.        Assessment & Plan:  #1 malaise #2 cough See AVS

## 2015-03-10 NOTE — Patient Instructions (Signed)
Watch for: Frontal headache, facial pain , nasal purulence, dental pain, sore throat , ear discharge ,sputum production,  diarrhea ,cola colored urine or clay colored stools ,pain with urination, blood in the urine, new rashes, redness or swelling of the joints. Fill the antibiotic Rx  if you have fever and cough productive of discolored secretions.

## 2015-08-28 ENCOUNTER — Other Ambulatory Visit (INDEPENDENT_AMBULATORY_CARE_PROVIDER_SITE_OTHER): Payer: BLUE CROSS/BLUE SHIELD

## 2015-08-28 ENCOUNTER — Encounter: Payer: Self-pay | Admitting: Internal Medicine

## 2015-08-28 ENCOUNTER — Ambulatory Visit (INDEPENDENT_AMBULATORY_CARE_PROVIDER_SITE_OTHER): Payer: BLUE CROSS/BLUE SHIELD | Admitting: Internal Medicine

## 2015-08-28 VITALS — BP 130/90 | HR 71 | Temp 98.1°F | Resp 16 | Ht 71.0 in | Wt 193.1 lb

## 2015-08-28 DIAGNOSIS — E785 Hyperlipidemia, unspecified: Secondary | ICD-10-CM

## 2015-08-28 DIAGNOSIS — Z0000 Encounter for general adult medical examination without abnormal findings: Secondary | ICD-10-CM

## 2015-08-28 LAB — COMPREHENSIVE METABOLIC PANEL WITH GFR
ALT: 13 U/L (ref 0–53)
AST: 14 U/L (ref 0–37)
Albumin: 4.5 g/dL (ref 3.5–5.2)
Alkaline Phosphatase: 58 U/L (ref 39–117)
BUN: 21 mg/dL (ref 6–23)
CO2: 27 meq/L (ref 19–32)
Calcium: 9.6 mg/dL (ref 8.4–10.5)
Chloride: 106 meq/L (ref 96–112)
Creatinine, Ser: 1.16 mg/dL (ref 0.40–1.50)
GFR: 68.05 mL/min
Glucose, Bld: 96 mg/dL (ref 70–99)
Potassium: 4.2 meq/L (ref 3.5–5.1)
Sodium: 140 meq/L (ref 135–145)
Total Bilirubin: 0.3 mg/dL (ref 0.2–1.2)
Total Protein: 7.3 g/dL (ref 6.0–8.3)

## 2015-08-28 LAB — LIPID PANEL
CHOL/HDL RATIO: 3
Cholesterol: 170 mg/dL (ref 0–200)
HDL: 51.2 mg/dL (ref >39.00)
LDL Cholesterol: 89 mg/dL (ref 0–99)
NONHDL: 119.01
Triglycerides: 152 mg/dL — ABNORMAL HIGH (ref 0.0–149.0)
VLDL: 30.4 mg/dL (ref 0.0–40.0)

## 2015-08-28 LAB — HEMOGLOBIN A1C: Hgb A1c MFr Bld: 5.9 % (ref 4.6–6.5)

## 2015-08-28 MED ORDER — CYCLOBENZAPRINE HCL 10 MG PO TABS: 10.0000 mg | ORAL_TABLET | Freq: Three times a day (TID) | ORAL | Status: DC | PRN

## 2015-08-28 NOTE — Progress Notes (Signed)
Pre visit review using our clinic review tool, if applicable. No additional management support is needed unless otherwise documented below in the visit note. 

## 2015-08-28 NOTE — Patient Instructions (Signed)
We will check the labs today and send you a copy in the mail.   Health Maintenance A healthy lifestyle and preventative care can promote health and wellness.  Maintain regular health, dental, and eye exams.  Eat a healthy diet. Foods like vegetables, fruits, whole grains, low-fat dairy products, and lean protein foods contain the nutrients you need and are low in calories. Decrease your intake of foods high in solid fats, added sugars, and salt. Get information about a proper diet from your health care provider, if necessary.  Regular physical exercise is one of the most important things you can do for your health. Most adults should get at least 150 minutes of moderate-intensity exercise (any activity that increases your heart rate and causes you to sweat) each week. In addition, most adults need muscle-strengthening exercises on 2 or more days a week.   Maintain a healthy weight. The body mass index (BMI) is a screening tool to identify possible weight problems. It provides an estimate of body fat based on height and weight. Your health care provider can find your BMI and can help you achieve or maintain a healthy weight. For males 20 years and older:  A BMI below 18.5 is considered underweight.  A BMI of 18.5 to 24.9 is normal.  A BMI of 25 to 29.9 is considered overweight.  A BMI of 30 and above is considered obese.  Maintain normal blood lipids and cholesterol by exercising and minimizing your intake of saturated fat. Eat a balanced diet with plenty of fruits and vegetables. Blood tests for lipids and cholesterol should begin at age 22 and be repeated every 5 years. If your lipid or cholesterol levels are high, you are over age 75, or you are at high risk for heart disease, you may need your cholesterol levels checked more frequently.Ongoing high lipid and cholesterol levels should be treated with medicines if diet and exercise are not working.  If you smoke, find out from your health  care provider how to quit. If you do not use tobacco, do not start.  Lung cancer screening is recommended for adults aged 55-80 years who are at high risk for developing lung cancer because of a history of smoking. A yearly low-dose CT scan of the lungs is recommended for people who have at least a 30-pack-year history of smoking and are current smokers or have quit within the past 15 years. A pack year of smoking is smoking an average of 1 pack of cigarettes a day for 1 year (for example, a 30-pack-year history of smoking could mean smoking 1 pack a day for 30 years or 2 packs a day for 15 years). Yearly screening should continue until the smoker has stopped smoking for at least 15 years. Yearly screening should be stopped for people who develop a health problem that would prevent them from having lung cancer treatment.  If you choose to drink alcohol, do not have more than 2 drinks per day. One drink is considered to be 12 oz (360 mL) of beer, 5 oz (150 mL) of wine, or 1.5 oz (45 mL) of liquor.  Avoid the use of street drugs. Do not share needles with anyone. Ask for help if you need support or instructions about stopping the use of drugs.  High blood pressure causes heart disease and increases the risk of stroke. Blood pressure should be checked at least every 1-2 years. Ongoing high blood pressure should be treated with medicines if weight loss and exercise  are not effective.  If you are 66-23 years old, ask your health care provider if you should take aspirin to prevent heart disease.  Diabetes screening involves taking a blood sample to check your fasting blood sugar level. This should be done once every 3 years after age 65 if you are at a normal weight and without risk factors for diabetes. Testing should be considered at a younger age or be carried out more frequently if you are overweight and have at least 1 risk factor for diabetes.  Colorectal cancer can be detected and often prevented.  Most routine colorectal cancer screening begins at the age of 70 and continues through age 61. However, your health care provider may recommend screening at an earlier age if you have risk factors for colon cancer. On a yearly basis, your health care provider may provide home test kits to check for hidden blood in the stool. A small camera at the end of a tube may be used to directly examine the colon (sigmoidoscopy or colonoscopy) to detect the earliest forms of colorectal cancer. Talk to your health care provider about this at age 80 when routine screening begins. A direct exam of the colon should be repeated every 5-10 years through age 57, unless early forms of precancerous polyps or small growths are found.  People who are at an increased risk for hepatitis B should be screened for this virus. You are considered at high risk for hepatitis B if:  You were born in a country where hepatitis B occurs often. Talk with your health care provider about which countries are considered high risk.  Your parents were born in a high-risk country and you have not received a shot to protect against hepatitis B (hepatitis B vaccine).  You have HIV or AIDS.  You use needles to inject street drugs.  You live with, or have sex with, someone who has hepatitis B.  You are a man who has sex with other men (MSM).  You get hemodialysis treatment.  You take certain medicines for conditions like cancer, organ transplantation, and autoimmune conditions.  Hepatitis C blood testing is recommended for all people born from [birth year] through 1965 and any individual with known risk factors for hepatitis C.  Healthy men should no longer receive prostate-specific antigen (PSA) blood tests as part of routine cancer screening. Talk to your health care provider about prostate cancer screening.  Testicular cancer screening is not recommended for adolescents or adult males who have no symptoms. Screening includes self-exam, a health  care provider exam, and other screening tests. Consult with your health care provider about any symptoms you have or any concerns you have about testicular cancer.  Practice safe sex. Use condoms and avoid high-risk sexual practices to reduce the spread of sexually transmitted infections (STIs).  You should be screened for STIs, including gonorrhea and chlamydia if:  You are sexually active and are younger than 24 years.  You are older than 24 years, and your health care provider tells you that you are at risk for this type of infection.  Your sexual activity has changed since you were last screened, and you are at an increased risk for chlamydia or gonorrhea. Ask your health care provider if you are at risk.  If you are at risk of being infected with HIV, it is recommended that you take a prescription medicine daily to prevent HIV infection. This is called pre-exposure prophylaxis (PrEP). You are considered at risk if:  You are  a man who has sex with other men (MSM).  You are a heterosexual man who is sexually active with multiple partners.  You take drugs by injection.  You are sexually active with a partner who has HIV.  Talk with your health care provider about whether you are at high risk of being infected with HIV. If you choose to begin PrEP, you should first be tested for HIV. You should then be tested every 3 months for as long as you are taking PrEP.  Use sunscreen. Apply sunscreen liberally and repeatedly throughout the day. You should seek shade when your shadow is shorter than you. Protect yourself by wearing long sleeves, pants, a wide-brimmed hat, and sunglasses year round whenever you are outdoors.  Tell your health care provider of new moles or changes in moles, especially if there is a change in shape or color. Also, tell your health care provider if a mole is larger than the size of a pencil eraser.  A one-time screening for abdominal aortic aneurysm (AAA) and surgical  repair of large AAAs by ultrasound is recommended for men aged 65-75 years who are current or former smokers.  Stay current with your vaccines (immunizations). Document Released: 05/09/2008 Document Revised: 11/16/2013 Document Reviewed: 04/08/2011 [facility] Patient Information 2015 [place], [place]. This information is not intended to replace advice given to you by your health care provider. Make sure you discuss any questions you have with your health care provider.

## 2015-08-28 NOTE — Progress Notes (Signed)
   Subjective:    Patient ID: Leonard Soto, male    DOB: 25-Jul-1956, 59 y.o.   MRN: [id number]  HPI The patient is a 59 YO man coming in for wellness. No new concerns, please see A/P for status and treatment of chronic medical problems.   PMH, [facility], social history reviewed and updated.   Review of Systems  Constitutional: Negative for fever, activity change, appetite change and fatigue.  HENT: Negative.   Eyes: Negative.   Respiratory: Negative for cough, chest tightness, shortness of breath and wheezing.   Cardiovascular: Negative for chest pain, palpitations and leg swelling.  Gastrointestinal: Negative for abdominal pain, diarrhea, constipation and abdominal distention.  Endocrine: Negative.   Genitourinary: Negative.   Musculoskeletal: Negative.   Skin: Negative.   Neurological: Negative.       Objective:   Physical Exam  Constitutional: He is oriented to person, place, and time. He appears well-developed and well-nourished. No distress.  HENT:  Head: Normocephalic and atraumatic.  Eyes: EOM are normal.  Neck: Normal range of motion.  Cardiovascular: Normal rate and regular rhythm.   Pulmonary/Chest: Effort normal and breath sounds normal. No respiratory distress. He has no wheezes. He has no rales.  Abdominal: Soft. Bowel sounds are normal. He exhibits no distension. There is no tenderness. There is no rebound.  Neurological: He is alert and oriented to person, place, and time. Coordination normal.  Skin: Skin is warm and dry.  Vitals reviewed.  Filed Vitals:   08/28/15 1539  BP: 130/90  Pulse: 71  Temp: 98.1 F (36.7 C)  TempSrc: Oral  Resp: 16  Height:  (1.803 m)  Weight: 193 lb 1.9 oz (87.599 kg)  SpO2: 96%      Assessment & Plan:  Flu shot given at visit.

## 2015-08-28 NOTE — Assessment & Plan Note (Signed)
Talked to him about the shingles shot. Flu shot given at visit. Colonoscopy due in 2018. Talked to him about the need for some weight loss and he will think about changes he can make to add exercise to his life.

## 2015-08-28 NOTE — Assessment & Plan Note (Signed)
Checking lipid panel today, adjust as needed. On lipitor 20 mg daily without side effects.

## 2016-03-06 DIAGNOSIS — H5203 Hypermetropia, bilateral: Secondary | ICD-10-CM | POA: Diagnosis not present

## 2016-03-13 DIAGNOSIS — B0052 Herpesviral keratitis: Secondary | ICD-10-CM | POA: Diagnosis not present

## 2016-03-13 DIAGNOSIS — B0230 Zoster ocular disease, unspecified: Secondary | ICD-10-CM | POA: Diagnosis not present

## 2016-04-24 DIAGNOSIS — B0230 Zoster ocular disease, unspecified: Secondary | ICD-10-CM | POA: Diagnosis not present

## 2016-04-24 DIAGNOSIS — B0052 Herpesviral keratitis: Secondary | ICD-10-CM | POA: Diagnosis not present

## 2016-07-24 DIAGNOSIS — B0052 Herpesviral keratitis: Secondary | ICD-10-CM | POA: Diagnosis not present

## 2016-07-24 DIAGNOSIS — B0230 Zoster ocular disease, unspecified: Secondary | ICD-10-CM | POA: Diagnosis not present

## 2016-08-08 ENCOUNTER — Other Ambulatory Visit: Payer: Self-pay | Admitting: Internal Medicine

## 2016-08-13 ENCOUNTER — Other Ambulatory Visit: Payer: Self-pay | Admitting: *Deleted

## 2016-08-13 MED ORDER — ATORVASTATIN CALCIUM 20 MG PO TABS: ORAL_TABLET | ORAL | 0 refills | Status: DC

## 2016-09-25 LAB — LIPID PANEL
CHOLESTEROL: 193 mg/dL (ref 0–200)
HDL: 56 mg/dL (ref 35–70)
LDL Cholesterol: 118 mg/dL
TRIGLYCERIDES: 95 mg/dL (ref 40–160)

## 2016-09-25 LAB — BASIC METABOLIC PANEL: GLUCOSE: 87 mg/dL

## 2016-10-30 ENCOUNTER — Other Ambulatory Visit: Payer: Self-pay | Admitting: *Deleted

## 2017-02-11 ENCOUNTER — Telehealth: Payer: Self-pay | Admitting: Internal Medicine

## 2017-02-11 NOTE — Telephone Encounter (Signed)
Requesting refill on atorvastatin 20mg .

## 2017-02-11 NOTE — Telephone Encounter (Signed)
Patient hasn't been seen since 2016

## 2017-02-11 NOTE — Telephone Encounter (Signed)
Left vm for patient to call back in regard to notify he can not get refills until his appt on the 29th.

## 2017-02-20 ENCOUNTER — Other Ambulatory Visit (INDEPENDENT_AMBULATORY_CARE_PROVIDER_SITE_OTHER): Payer: BLUE CROSS/BLUE SHIELD

## 2017-02-20 ENCOUNTER — Encounter: Payer: Self-pay | Admitting: Internal Medicine

## 2017-02-20 ENCOUNTER — Ambulatory Visit (INDEPENDENT_AMBULATORY_CARE_PROVIDER_SITE_OTHER): Payer: BLUE CROSS/BLUE SHIELD | Admitting: Internal Medicine

## 2017-02-20 VITALS — BP 130/72 | HR 51 | Temp 97.8°F | Resp 12 | Ht 71.0 in | Wt 188.0 lb

## 2017-02-20 DIAGNOSIS — E785 Hyperlipidemia, unspecified: Secondary | ICD-10-CM

## 2017-02-20 DIAGNOSIS — Z0000 Encounter for general adult medical examination without abnormal findings: Secondary | ICD-10-CM | POA: Diagnosis not present

## 2017-02-20 DIAGNOSIS — Z1159 Encounter for screening for other viral diseases: Secondary | ICD-10-CM

## 2017-02-20 LAB — COMPREHENSIVE METABOLIC PANEL
ALBUMIN: 4.3 g/dL (ref 3.5–5.2)
ALK PHOS: 51 U/L (ref 39–117)
ALT: 13 U/L (ref 0–53)
AST: 13 U/L (ref 0–37)
BUN: 19 mg/dL (ref 6–23)
CALCIUM: 9.4 mg/dL (ref 8.4–10.5)
CHLORIDE: 105 meq/L (ref 96–112)
CO2: 29 mEq/L (ref 19–32)
Creatinine, Ser: 1.01 mg/dL (ref 0.40–1.50)
GFR: 79.45 mL/min (ref >60.00)
Glucose, Bld: 90 mg/dL (ref 70–99)
POTASSIUM: 4.3 meq/L (ref 3.5–5.1)
SODIUM: 139 meq/L (ref 135–145)
Total Bilirubin: 0.4 mg/dL (ref 0.2–1.2)
Total Protein: 6.9 g/dL (ref 6.0–8.3)

## 2017-02-20 LAB — HEPATITIS C ANTIBODY: HCV AB: NEGATIVE

## 2017-02-20 LAB — HEMOGLOBIN A1C: HEMOGLOBIN A1C: 5.9 % (ref 4.6–6.5)

## 2017-02-20 NOTE — Assessment & Plan Note (Signed)
Checking labs and hep c screening. Declines colonoscopy but will check on coverage of cologuard. Tetanus and flu shot up to date. Declines need for hiv screening. Counseled on sun safety and mole surveillance. Given screening recommendations.

## 2017-02-20 NOTE — Patient Instructions (Signed)
We will check the labs today and call you back about the results.  Check with your insurance company about cologuard to see how much it would cost you. This is the colon cancer screening test that we talked about.    Health Maintenance, Male A healthy lifestyle and preventive care is important for your health and wellness. Ask your health care provider about what schedule of regular examinations is right for you. What should I know about weight and diet?  Eat a Healthy Diet  Eat plenty of vegetables, fruits, whole grains, low-fat dairy products, and lean protein.  Do not eat a lot of foods high in solid fats, added sugars, or salt. Maintain a Healthy Weight  Regular exercise can help you achieve or maintain a healthy weight. You should:  Do at least 150 minutes of exercise each week. The exercise should increase your heart rate and make you sweat (moderate-intensity exercise).  Do strength-training exercises at least twice a week. Watch Your Levels of Cholesterol and Blood Lipids  Have your blood tested for lipids and cholesterol every 5 years starting at 63 years of age. If you are at high risk for heart disease, you should start having your blood tested when you are 63 years old. You may need to have your cholesterol levels checked more often if:  Your lipid or cholesterol levels are high.  You are older than 63 years of age.  You are at high risk for heart disease. What should I know about cancer screening? Many types of cancers can be detected early and may often be prevented. Lung Cancer  You should be screened every year for lung cancer if:  You are a current smoker who has smoked for at least 30 years.  You are a former smoker who has quit within the past 15 years.  Talk to your health care provider about your screening options, when you should start screening, and how often you should be screened. Colorectal Cancer  Routine colorectal cancer screening usually begins  at 63 years of age and should be repeated every 5-10 years until you are 63 years old. You may need to be screened more often if early forms of precancerous polyps or small growths are found. Your health care provider may recommend screening at an earlier age if you have risk factors for colon cancer.  Your health care provider may recommend using home test kits to check for hidden blood in the stool.  A small camera at the end of a tube can be used to examine your colon (sigmoidoscopy or colonoscopy). This checks for the earliest forms of colorectal cancer. Prostate and Testicular Cancer  Depending on your age and overall health, your health care provider may do certain tests to screen for prostate and testicular cancer.  Talk to your health care provider about any symptoms or concerns you have about testicular or prostate cancer. Skin Cancer  Check your skin from head to toe regularly.  Tell your health care provider about any new moles or changes in moles, especially if:  There is a change in a mole's size, shape, or color.  You have a mole that is larger than a pencil eraser.  Always use sunscreen. Apply sunscreen liberally and repeat throughout the day.  Protect yourself by wearing long sleeves, pants, a wide-brimmed hat, and sunglasses when outside. What should I know about heart disease, diabetes, and high blood pressure?  If you are 3518-63 years of age, have your blood pressure checked  every 3-5 years. If you are 30 years of age or older, have your blood pressure checked every year. You should have your blood pressure measured twice-once when you are at a hospital or clinic, and once when you are not at a hospital or clinic. Record the average of the two measurements. To check your blood pressure when you are not at a hospital or clinic, you can use:  An automated blood pressure machine at a pharmacy.  A home blood pressure monitor.  Talk to your health care provider about your  target blood pressure.  If you are between 40-61 years old, ask your health care provider if you should take aspirin to prevent heart disease.  Have regular diabetes screenings by checking your fasting blood sugar level.  If you are at a normal weight and have a low risk for diabetes, have this test once every three years after the age of 55.  If you are overweight and have a high risk for diabetes, consider being tested at a younger age or more often.  A one-time screening for abdominal aortic aneurysm (AAA) by ultrasound is recommended for men aged 65-75 years who are current or former smokers. What should I know about preventing infection? Hepatitis B  If you have a higher risk for hepatitis B, you should be screened for this virus. Talk with your health care provider to find out if you are at risk for hepatitis B infection. Hepatitis C  Blood testing is recommended for:  Everyone born from [birth year] through 1967.  Anyone with known risk factors for hepatitis C. Sexually Transmitted Diseases (STDs)  You should be screened each year for STDs including gonorrhea and chlamydia if:  You are sexually active and are younger than 63 years of age.  You are older than 63 years of age and your health care provider tells you that you are at risk for this type of infection.  Your sexual activity has changed since you were last screened and you are at an increased risk for chlamydia or gonorrhea. Ask your health care provider if you are at risk.  Talk with your health care provider about whether you are at high risk of being infected with HIV. Your health care provider may recommend a prescription medicine to help prevent HIV infection. What else can I do?  Schedule regular health, dental, and eye exams.  Stay current with your vaccines (immunizations).  Do not use any tobacco products, such as cigarettes, chewing tobacco, and e-cigarettes. If you need help quitting, ask your health care  provider.  Limit alcohol intake to no more than 2 drinks per day. One drink equals 12 ounces of beer, 5 ounces of wine, or 1 ounces of hard liquor.  Do not use street drugs.  Do not share needles.  Ask your health care provider for help if you need support or information about quitting drugs.  Tell your health care provider if you often feel depressed.  Tell your health care provider if you have ever been abused or do not feel safe at home. This information is not intended to replace advice given to you by your health care provider. Make sure you discuss any questions you have with your health care provider. Document Released: 05/09/2008 Document Revised: 07/10/2016 Document Reviewed: 08/15/2015 Elsevier Interactive Patient Education  2017 [employer].

## 2017-02-20 NOTE — Progress Notes (Signed)
   Subjective:    Patient ID: Leonard Soto, male    DOB: 17-Apr-1956, 61 y.o.   MRN: [id number]  HPI  The patient is a 61 YO man coming in for physical. No new concerns.  [facility], [facility] reviewed and updated.   Review of Systems  Constitutional: Negative.   HENT: Negative.   Eyes: Negative.   Respiratory: Negative for cough, chest tightness and shortness of breath.   Cardiovascular: Negative for chest pain, palpitations and leg swelling.  Gastrointestinal: Negative for abdominal distention, abdominal pain, constipation, diarrhea, nausea and vomiting.  Musculoskeletal: Negative.   Skin: Negative.   Neurological: Negative.   Psychiatric/Behavioral: Negative.       Objective:   Physical Exam  Constitutional: He is oriented to person, place, and time. He appears well-developed and well-nourished.  HENT:  Head: Normocephalic and atraumatic.  Eyes: EOM are normal.  Neck: Normal range of motion.  Cardiovascular: Normal rate and regular rhythm.   Pulmonary/Chest: Effort normal and breath sounds normal. No respiratory distress. He has no wheezes. He has no rales.  Abdominal: Soft. Bowel sounds are normal. He exhibits no distension. There is no tenderness. There is no rebound.  Musculoskeletal: He exhibits no edema.  Neurological: He is alert and oriented to person, place, and time. Coordination normal.  Skin: Skin is warm and dry.  Psychiatric: He has a normal mood and affect.   Vitals:   02/20/17 0758  BP: 130/72  Pulse: (!) 51  Resp: 12  Temp: 97.8 F (36.6 C)  TempSrc: Oral  SpO2: 99%  Weight: 188 lb (85.3 kg)  Height: 5\' 11"  (1.803 m)      Assessment & Plan:

## 2017-02-20 NOTE — Progress Notes (Signed)
Pre visit review using our clinic review tool, if applicable. No additional management support is needed unless otherwise documented below in the visit note. Results entered and sent to scan  

## 2017-02-20 NOTE — Assessment & Plan Note (Signed)
Lipid panel reviewed and entered. Lipitor 20 mg daily at goal of LDL <130.

## 2017-02-25 ENCOUNTER — Telehealth: Payer: Self-pay | Admitting: Internal Medicine

## 2017-02-25 NOTE — Telephone Encounter (Signed)
Did you call this patient last night?

## 2017-02-25 NOTE — Telephone Encounter (Signed)
No, I don't think I called him for anything

## 2017-03-03 ENCOUNTER — Telehealth: Payer: Self-pay

## 2017-03-03 MED ORDER — ATORVASTATIN CALCIUM 20 MG PO TABS: 20.0000 mg | ORAL_TABLET | Freq: Every day | ORAL | 3 refills | Status: DC

## 2017-03-03 NOTE — Telephone Encounter (Signed)
refill 

## 2017-04-14 ENCOUNTER — Encounter: Payer: Self-pay | Admitting: Internal Medicine

## 2017-04-14 ENCOUNTER — Ambulatory Visit (INDEPENDENT_AMBULATORY_CARE_PROVIDER_SITE_OTHER): Payer: BLUE CROSS/BLUE SHIELD | Admitting: Internal Medicine

## 2017-04-14 DIAGNOSIS — J208 Acute bronchitis due to other specified organisms: Secondary | ICD-10-CM

## 2017-04-14 MED ORDER — PREDNISONE 20 MG PO TABS: 40.0000 mg | ORAL_TABLET | Freq: Every day | ORAL | 0 refills | Status: DC

## 2017-04-14 NOTE — Progress Notes (Signed)
   Subjective:    Patient ID: Leonard Soto, male    DOB: May 05, 1956, 61 y.o.   MRN: [id number]  HPI The patient is a 61 YO man coming in for 3 days of cough and SOB. He has some congestion and drainage this weekend. Some flu going around his work. Having fevers and chills since onset. Coughing up green sputum. Some nasal drainage but no headaches. Worsening since onset. Not sleeping well. Is not taking anything for symptoms as he was not sure what was safe to take. Has taken tylenol for fever last around lunchtime.   Review of Systems  Constitutional: Positive for activity change, appetite change, chills, fatigue and fever. Negative for unexpected weight change.  HENT: Positive for congestion, postnasal drip, rhinorrhea and sinus pressure. Negative for dental problem, drooling, ear discharge, ear pain, sinus pain, sore throat, tinnitus and trouble swallowing.   Eyes: Negative.   Respiratory: Positive for cough and shortness of breath. Negative for chest tightness and wheezing.   Cardiovascular: Negative.   Gastrointestinal: Negative.   Musculoskeletal: Positive for myalgias.       Objective:   Physical Exam  Constitutional: He is oriented to person, place, and time. He appears well-developed and well-nourished.  HENT:  Head: Normocephalic and atraumatic.  Oropharynx with redness and clear drainage, nose without crusting.   Eyes: EOM are normal.  Neck: Normal range of motion. No JVD present.  Cardiovascular: Normal rate and regular rhythm.   Pulmonary/Chest: Effort normal. No respiratory distress. He has no wheezes. He has no rales.  Scattered rhonchi which do not clear with coughing  Abdominal: Soft.  Musculoskeletal: He exhibits no edema.  Lymphadenopathy:    He has cervical adenopathy.  Neurological: He is alert and oriented to person, place, and time.  Skin: Skin is warm and dry.   Vitals:   04/14/17 1301  BP: 136/78  Pulse: 61  Resp: 12  Temp: 98.7 F (37.1 C)  TempSrc:  Oral  SpO2: 98%  Weight: 187 lb (84.8 kg)  Height: 5\' 11"  (1.803 m)      Assessment & Plan:

## 2017-04-14 NOTE — Patient Instructions (Signed)
We have sent in the prednisone to help with symptoms. Take 2 pills daily for 5 days.   Also start taking zyrtec daily to help prevent the allergies.

## 2017-04-14 NOTE — Assessment & Plan Note (Signed)
Rx for prednisone and encouraged to start taking zyrtec daily. Given ongoing fevers recommend to stay out of work until Thursday. Call back for worsening. Outside window for tamiflu so flu; testing not done.

## 2017-06-04 ENCOUNTER — Encounter: Payer: Self-pay | Admitting: Family Medicine

## 2017-06-04 ENCOUNTER — Ambulatory Visit (INDEPENDENT_AMBULATORY_CARE_PROVIDER_SITE_OTHER): Payer: BLUE CROSS/BLUE SHIELD | Admitting: Family Medicine

## 2017-06-04 VITALS — BP 130/80 | HR 57 | Temp 97.7°F | Resp 12 | Ht 71.0 in | Wt 185.0 lb

## 2017-06-04 DIAGNOSIS — R112 Nausea with vomiting, unspecified: Secondary | ICD-10-CM | POA: Diagnosis not present

## 2017-06-04 NOTE — Patient Instructions (Signed)
Eat a bland diet today  Can take Pepto-bismol, mylanta, tums as needed  Please let us know if you get worse

## 2017-06-06 NOTE — Progress Notes (Signed)
   Subjective:    Patient ID: Leonard LabrumRandy D Wiedel, male    DOB: September 05, 1956, 61 y.o.   MRN: [id number]  HPI This is a 61 yo male who presents today with stomach queeziness, vomiting x 1 while at work this morning. No fever, no diarrhea. Stomach feeling better now. No abdominal pain currently. No headache, no weakness. He ate breakfast as  Usual. Has otherwise been feeling well. Took a Tums yesterday. 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 These chemicals are in sealed containers, he has had not direct contact with the chemicals or vapors. 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 He has never had similar symptoms.   He requests work note.   Past Medical History:  Diagnosis Date  . Hyperlipidemia    Past Surgical History:  Procedure Laterality Date  . COLONOSCOPY  12/10/2006   Family History  Problem Relation Age of Onset  . Hypertension Mother   . COPD Mother   . CVA Mother   . Hypertension Father   . Hyperlipidemia Father   . Benign prostatic hyperplasia Father    Social History  Substance Use Topics  . Smoking status: Never Smoker  . Smokeless tobacco: Never Used  . Alcohol use No    Review of Systems Per  HPI    Objective:   Physical Exam  Constitutional: He is oriented to person, place, and time. He appears well-developed and well-nourished. No distress.  HENT:  Head: Normocephalic and atraumatic.  Eyes: Conjunctivae are normal.  Cardiovascular: Normal rate, regular rhythm and normal heart sounds.   Pulmonary/Chest: Effort normal and breath sounds normal.  Abdominal: Soft. Bowel sounds are normal. He exhibits no distension. There is no tenderness. There is no rebound and no guarding.  Neurological: He is alert and oriented to person, place, and time.  Skin: Skin is warm and dry. He is not diaphoretic.  Psychiatric: He has a normal mood and affect. His behavior is normal. Judgment and thought content normal.    Vitals reviewed.        BP 130/80 (BP Location: Left Arm, Patient Position: Sitting, Cuff Size: Normal)   Pulse (!) 57   Temp 97.7 F (36.5 C) (Oral)   Resp 12   Ht 5\' 11"  (1.803 m)   Wt 185 lb (83.9 kg)   SpO2 98%   BMI 25.80 kg/m   Assessment & Plan:  1. Non-intractable vomiting with nausea, unspecified vomiting type - does not seem likely related to chemical exposure as chemicals are in sealed containers - feeling better now - bland diet today - RTC/ED precautions reviwed  Olean Reeeborah Eesa Justiss, FNP-BC  [facility], [facility]  06/06/2017 8:05 AM

## 2018-07-02 DIAGNOSIS — Z0279 Encounter for issue of other medical certificate: Secondary | ICD-10-CM

## 2018-10-08 ENCOUNTER — Encounter: Payer: Self-pay | Admitting: Internal Medicine

## 2018-10-08 ENCOUNTER — Ambulatory Visit (INDEPENDENT_AMBULATORY_CARE_PROVIDER_SITE_OTHER): Payer: BLUE CROSS/BLUE SHIELD | Admitting: Internal Medicine

## 2018-10-08 VITALS — BP 110/70 | HR 62 | Temp 98.6°F | Ht 71.0 in | Wt 190.0 lb

## 2018-10-08 DIAGNOSIS — E782 Mixed hyperlipidemia: Secondary | ICD-10-CM

## 2018-10-08 DIAGNOSIS — Z0000 Encounter for general adult medical examination without abnormal findings: Secondary | ICD-10-CM

## 2018-10-08 MED ORDER — ATORVASTATIN CALCIUM 20 MG PO TABS: 20.0000 mg | ORAL_TABLET | Freq: Every day | ORAL | 3 refills | Status: DC

## 2018-10-08 NOTE — Progress Notes (Signed)
   Subjective:    Patient ID: Leonard Soto, male    DOB: [birth year], 62 y.o.   MRN: [id number]  HPI The patient is a 62 YO man coming in for physical. Just got blood work through [employer].  [facility], social [facility] reviewed and updated  Review of Systems  Constitutional: Negative.   HENT: Negative.   Eyes: Negative.   Respiratory: Negative for cough, chest tightness and shortness of breath.   Cardiovascular: Negative for chest pain, palpitations and leg swelling.  Gastrointestinal: Negative for abdominal distention, abdominal pain, constipation, diarrhea, nausea and vomiting.  Musculoskeletal: Negative.   Skin: Negative.   Neurological: Negative.   Psychiatric/Behavioral: Negative.       Objective:   Physical Exam  Constitutional: He is oriented to person, place, and time. He appears well-developed and well-nourished.  HENT:  Head: Normocephalic and atraumatic.  Eyes:  Left eyelid droop  Neck: Normal range of motion.  Cardiovascular: Normal rate and regular rhythm.  Pulmonary/Chest: Effort normal and breath sounds normal. No respiratory distress. He has no wheezes. He has no rales.  Abdominal: Soft. Bowel sounds are normal. He exhibits no distension. There is no tenderness. There is no rebound.  Musculoskeletal: He exhibits no edema.  Neurological: He is alert and oriented to person, place, and time. Coordination abnormal.  Skin: Skin is warm and dry.  Psychiatric: He has a normal mood and affect.   Vitals:   10/08/18 1311  BP: 110/70  Pulse: 62  Temp: 98.6 F (37 C)  TempSrc: Oral  SpO2: 96%  Weight: 190 lb (86.2 kg)  Height: 5\' 11"  (1.803 m)      Assessment & Plan:

## 2018-10-08 NOTE — Patient Instructions (Signed)
Make sure to bring us the labs.   Think about doing cologuard for the colon cancer screening. Let us know if you want to do this.    Health Maintenance, Male A healthy lifestyle and preventive care is important for your health and wellness. Ask your health care provider about what schedule of regular examinations is right for you. What should I know about weight and diet? Eat a Healthy Diet  Eat plenty of vegetables, fruits, whole grains, low-fat dairy products, and lean protein.  Do not eat a lot of foods high in solid fats, added sugars, or salt.  Maintain a Healthy Weight Regular exercise can help you achieve or maintain a healthy weight. You should:  Do at least 150 minutes of exercise each week. The exercise should increase your heart rate and make you sweat (moderate-intensity exercise).  Do strength-training exercises at least twice a week.  Watch Your Levels of Cholesterol and Blood Lipids  Have your blood tested for lipids and cholesterol every 5 years starting at 64 years of age. If you are at high risk for heart disease, you should start having your blood tested when you are 64 years old. You may need to have your cholesterol levels checked more often if: ? Your lipid or cholesterol levels are high. ? You are older than 64 years of age. ? You are at high risk for heart disease.  What should I know about cancer screening? Many types of cancers can be detected early and may often be prevented. Lung Cancer  You should be screened every year for lung cancer if: ? You are a current smoker who has smoked for at least 30 years. ? You are a former smoker who has quit within the past 15 years.  Talk to your health care provider about your screening options, when you should start screening, and how often you should be screened.  Colorectal Cancer  Routine colorectal cancer screening usually begins at 64 years of age and should be repeated every 5-10 years until you are 64  years old. You may need to be screened more often if early forms of precancerous polyps or small growths are found. Your health care provider may recommend screening at an earlier age if you have risk factors for colon cancer.  Your health care provider may recommend using home test kits to check for hidden blood in the stool.  A small camera at the end of a tube can be used to examine your colon (sigmoidoscopy or colonoscopy). This checks for the earliest forms of colorectal cancer.  Prostate and Testicular Cancer  Depending on your age and overall health, your health care provider may do certain tests to screen for prostate and testicular cancer.  Talk to your health care provider about any symptoms or concerns you have about testicular or prostate cancer.  Skin Cancer  Check your skin from head to toe regularly.  Tell your health care provider about any new moles or changes in moles, especially if: ? There is a change in a mole's size, shape, or color. ? You have a mole that is larger than a pencil eraser.  Always use sunscreen. Apply sunscreen liberally and repeat throughout the day.  Protect yourself by wearing long sleeves, pants, a wide-brimmed hat, and sunglasses when outside.  What should I know about heart disease, diabetes, and high blood pressure?  If you are 7118-64 years of age, have your blood pressure checked every 3-5 years. If you are 40 years  of age or older, have your blood pressure checked every year. You should have your blood pressure measured twice-once when you are at a hospital or clinic, and once when you are not at a hospital or clinic. Record the average of the two measurements. To check your blood pressure when you are not at a hospital or clinic, you can use: ? An automated blood pressure machine at a pharmacy. ? A home blood pressure monitor.  Talk to your health care provider about your target blood pressure.  If you are between 29-10 years old, ask your  health care provider if you should take aspirin to prevent heart disease.  Have regular diabetes screenings by checking your fasting blood sugar level. ? If you are at a normal weight and have a low risk for diabetes, have this test once every three years after the age of 16. ? If you are overweight and have a high risk for diabetes, consider being tested at a younger age or more often.  A one-time screening for abdominal aortic aneurysm (AAA) by ultrasound is recommended for men aged 33-75 years who are current or former smokers. What should I know about preventing infection? Hepatitis B If you have a higher risk for hepatitis B, you should be screened for this virus. Talk with your health care provider to find out if you are at risk for hepatitis B infection. Hepatitis C Blood testing is recommended for:  Everyone born from [birth year] through 1967.  Anyone with known risk factors for hepatitis C.  Sexually Transmitted Diseases (STDs)  You should be screened each year for STDs including gonorrhea and chlamydia if: ? You are sexually active and are younger than 64 years of age. ? You are older than 64 years of age and your health care provider tells you that you are at risk for this type of infection. ? Your sexual activity has changed since you were last screened and you are at an increased risk for chlamydia or gonorrhea. Ask your health care provider if you are at risk.  Talk with your health care provider about whether you are at high risk of being infected with HIV. Your health care provider may recommend a prescription medicine to help prevent HIV infection.  What else can I do?  Schedule regular health, dental, and eye exams.  Stay current with your vaccines (immunizations).  Do not use any tobacco products, such as cigarettes, chewing tobacco, and e-cigarettes. If you need help quitting, ask your health care provider.  Limit alcohol intake to no more than 2 drinks per day. One  drink equals 12 ounces of beer, 5 ounces of wine, or 1 ounces of hard liquor.  Do not use street drugs.  Do not share needles.  Ask your health care provider for help if you need support or information about quitting drugs.  Tell your health care provider if you often feel depressed.  Tell your health care provider if you have ever been abused or do not feel safe at home. This information is not intended to replace advice given to you by your health care provider. Make sure you discuss any questions you have with your health care provider. Document Released: 05/09/2008 Document Revised: 07/10/2016 Document Reviewed: 08/15/2015 Elsevier Interactive Patient Education  [employer].

## 2018-10-08 NOTE — Assessment & Plan Note (Signed)
States just had labs and will bring copies. Adjust lipitor 20 mg daily as needed.

## 2018-10-08 NOTE — Assessment & Plan Note (Signed)
Flu shot up to date. Shingrix counseled. Tetanus declines. Colonoscopy due, counseled. Counseled about sun safety and mole surveillance. Counseled about the dangers of distracted driving. Given 10 year screening recommendations.

## 2019-01-13 ENCOUNTER — Other Ambulatory Visit: Payer: Self-pay | Admitting: Internal Medicine

## 2019-01-13 MED ORDER — ATORVASTATIN CALCIUM 20 MG PO TABS: 20.0000 mg | ORAL_TABLET | Freq: Every day | ORAL | 2 refills | Status: DC

## 2019-01-13 NOTE — Telephone Encounter (Signed)
Remainder of prescription sent to mail order pharmacy per patient request Requested Prescriptions  Pending Prescriptions Disp Refills  . atorvastatin (LIPITOR) 20 MG tablet 90 tablet 2    Sig: Take 1 tablet (20 mg total) by mouth daily.     Cardiovascular:  Antilipid - Statins Failed - 01/13/2019  8:22 AM      Failed - Total Cholesterol in normal range and within 360 days    Cholesterol  Date Value Ref Range Status  09/25/2016 193 0 - 200 mg/dL Final         Failed - LDL in normal range and within 360 days    LDL Cholesterol  Date Value Ref Range Status  09/25/2016 118 mg/dL Final         Failed - HDL in normal range and within 360 days    HDL  Date Value Ref Range Status  09/25/2016 56 35 - 70 mg/dL Final         Failed - Triglycerides in normal range and within 360 days    Triglycerides  Date Value Ref Range Status  09/25/2016 95 40 - 160 mg/dL Final         Passed - Patient is not pregnant      Passed - Valid encounter within last 12 months    Recent Outpatient Visits          3 months ago Routine general medical examination at a health care facility   [facility] -Willis Modena, MD   1 year ago Non-intractable vomiting with nausea, unspecified vomiting type   [facility] -Jetty Peeks, Binnie Rail, FNP   1 year ago Viral bronchitis   [facility] -Willis Modena, MD   1 year ago Routine general medical examination at a health care facility   [facility] -Willis Modena, MD   3 years ago Routine general medical examination at a health care facility   [facility] -Willis Modena, MD

## 2019-01-13 NOTE — Telephone Encounter (Signed)
Copied from CRM [id number]. Topic: Quick Communication - Rx Refill/Question >> Jan 13, 2019  8:16 AM Gerrianne Scale wrote: Medication: atorvastatin (LIPITOR) 20 MG tablet  Has the patient contacted their pharmacy? Yes.  Pharmacy sent him a letter in mail (Agent: If no, request that the patient contact the pharmacy for the refill.) (Agent: If yes, when and what did the pharmacy advise?) to reach out to provider  Preferred Pharmacy (with phone number or street name): [facility]Mail [facility]) Electronic  In patients chart under pharmacy  Agent: Please be advised that RX refills may take up to 3 business days. We ask that you follow-up with your pharmacy.

## 2019-03-08 ENCOUNTER — Ambulatory Visit (INDEPENDENT_AMBULATORY_CARE_PROVIDER_SITE_OTHER): Payer: BLUE CROSS/BLUE SHIELD | Admitting: Internal Medicine

## 2019-03-08 ENCOUNTER — Encounter: Payer: Self-pay | Admitting: Internal Medicine

## 2019-03-08 DIAGNOSIS — R6889 Other general symptoms and signs: Secondary | ICD-10-CM

## 2019-03-08 DIAGNOSIS — Z20822 Contact with and (suspected) exposure to covid-19: Secondary | ICD-10-CM

## 2019-03-08 MED ORDER — PROMETHAZINE-DM 6.25-15 MG/5ML PO SYRP: 5.0000 mL | ORAL_SOLUTION | Freq: Four times a day (QID) | ORAL | 0 refills | Status: DC | PRN

## 2019-03-08 NOTE — Assessment & Plan Note (Signed)
Advised to stay home until feeling well and then wait 3 more days until return to work. Work note done. Rx for promethazine cough syrup and advised to try zyrtec as well. Does have several risk factors for complications and advised to monitor for symptoms of SOB and seek care in ER for those symptoms. Talked about likely course.

## 2019-03-08 NOTE — Progress Notes (Signed)
Virtual Visit via Video Note  I connected with [place] Buggy Goldin on 03/08/19 at  9:20 AM EDT by a video enabled telemedicine application and verified that I am speaking with the correct person using two identifiers.   I discussed the limitations of evaluation and management by telemedicine and the availability of in person appointments. The patient expressed understanding and agreed to proceed.  History of Present Illness: The patient is a 63 y.o. man with visit for cough and cold symptoms. Started 2-3 days ago. Has cough, chest tightness, running nose, sinus drainage. Denies fever, chills, body aches, headaches. Overall it is stable. Has tried otc cold medicine without any benefit. Denies known exposure to Covid-19 or recent travel. Is currently out of work.  Observations/Objective: Appearance: normal, breathing appears normal, casual grooming, abdomen does not appear distended, throat red, memory normal, mental status is A and O times 3  Assessment and Plan: See problem oriented charting  Follow Up Instructions: rx for promethazine/dm cough medicine and isolate for next week, call for SOB or seek care in ER  [email or web address]  I discussed the assessment and treatment plan with the patient. The patient was provided an opportunity to ask questions and all were answered. The patient agreed with the plan and demonstrated an understanding of the instructions.   The patient was advised to call back or seek an in-person evaluation if the symptoms worsen or if the condition fails to improve as anticipated.  Myrlene Broker, MD

## 2019-08-16 ENCOUNTER — Other Ambulatory Visit: Payer: Self-pay | Admitting: Internal Medicine

## 2019-08-16 MED ORDER — ATORVASTATIN CALCIUM 20 MG PO TABS: 20.0000 mg | ORAL_TABLET | Freq: Every day | ORAL | 0 refills | Status: DC

## 2019-08-16 NOTE — Telephone Encounter (Signed)
Pt is requesting a refill for atorvastatin (LIPITOR) 20 MG tablet    Pharmacy:  [id number] [address], [place]. [phone] (Phone) [phone] (Fax)

## 2019-08-16 NOTE — Telephone Encounter (Signed)
Routing to CMA 

## 2019-08-16 NOTE — Telephone Encounter (Signed)
Requested medication (s) are due for refill today: yes  Requested medication (s) are on the active medication list: yes  Last refill:  03/09/2019  Future visit scheduled: no  Notes to clinic: review for refill   Requested Prescriptions  Pending Prescriptions Disp Refills   atorvastatin (LIPITOR) 20 MG tablet 90 tablet 2    Sig: Take 1 tablet (20 mg total) by mouth daily.     Cardiovascular:  Antilipid - Statins Failed - 08/16/2019  9:19 AM      Failed - Total Cholesterol in normal range and within 360 days    Cholesterol  Date Value Ref Range Status  09/25/2016 193 0 - 200 mg/dL Final         Failed - LDL in normal range and within 360 days    LDL Cholesterol  Date Value Ref Range Status  09/25/2016 118 mg/dL Final         Failed - HDL in normal range and within 360 days    HDL  Date Value Ref Range Status  09/25/2016 56 35 - 70 mg/dL Final         Failed - Triglycerides in normal range and within 360 days    Triglycerides  Date Value Ref Range Status  09/25/2016 95 40 - 160 mg/dL Final         Passed - Patient is not pregnant      Passed - Valid encounter within last 12 months    Recent Outpatient Visits          5 months ago Suspected 2019 Novel Coronavirus Infection   [place], MD   10 months ago Routine general medical examination at a health care facility   [place], Elizabeth A, MD   2 years ago Non-intractable vomiting with nausea, unspecified vomiting type   [place], FNP   2 years ago Viral bronchitis   [facility] -Chuck Hint, MD   2 years ago Routine general medical examination at a health care facility   [facility] -Chuck Hint, MD

## 2019-11-13 ENCOUNTER — Other Ambulatory Visit: Payer: Self-pay | Admitting: Internal Medicine

## 2019-12-12 ENCOUNTER — Encounter: Payer: Self-pay | Admitting: Internal Medicine

## 2019-12-13 ENCOUNTER — Other Ambulatory Visit: Payer: Self-pay | Admitting: Internal Medicine

## 2020-06-22 ENCOUNTER — Encounter: Payer: Self-pay | Admitting: Internal Medicine

## 2020-06-22 ENCOUNTER — Other Ambulatory Visit: Payer: Self-pay

## 2020-06-22 ENCOUNTER — Ambulatory Visit: Payer: BC Managed Care – PPO | Admitting: Internal Medicine

## 2020-06-22 VITALS — BP 122/76 | HR 66 | Temp 98.5°F | Ht 71.0 in | Wt 187.0 lb

## 2020-06-22 DIAGNOSIS — Z23 Encounter for immunization: Secondary | ICD-10-CM

## 2020-06-22 DIAGNOSIS — E782 Mixed hyperlipidemia: Secondary | ICD-10-CM | POA: Diagnosis not present

## 2020-06-22 DIAGNOSIS — Z0000 Encounter for general adult medical examination without abnormal findings: Secondary | ICD-10-CM | POA: Diagnosis not present

## 2020-06-22 MED ORDER — ATORVASTATIN CALCIUM 20 MG PO TABS: 20.0000 mg | ORAL_TABLET | Freq: Every day | ORAL | 3 refills | Status: DC

## 2020-06-22 NOTE — Patient Instructions (Signed)

## 2020-06-22 NOTE — Assessment & Plan Note (Signed)
Flu shot yearly. Covid-19 up to date. Pneumonia given 13 to start series today. Shingrix counseled. Tetanus declines today. Colonoscopy declines today. Counseled about sun safety and mole surveillance. Counseled about the dangers of distracted driving. Given 10 year screening recommendations.

## 2020-06-22 NOTE — Assessment & Plan Note (Signed)
Checking lipid panel and adjust lipitor 10 mg daily as needed.  

## 2020-06-22 NOTE — Progress Notes (Signed)
   Subjective:   Patient ID: Leonard Soto, male    DOB: 18-Feb-1956, 64 y.o.   MRN: [id number]  HPI The patient is a 64 YO man coming in for physical.   PMH, FMH, social history reviewed and updated  Review of Systems  Constitutional: Negative.   HENT: Negative.   Eyes: Negative.   Respiratory: Negative for cough, chest tightness and shortness of breath.   Cardiovascular: Negative for chest pain, palpitations and leg swelling.  Gastrointestinal: Negative for abdominal distention, abdominal pain, constipation, diarrhea, nausea and vomiting.  Musculoskeletal: Negative.   Skin: Negative.   Neurological: Negative.   Psychiatric/Behavioral: Negative.     Objective:  Physical Exam Constitutional:      Appearance: He is well-developed.  HENT:     Head: Normocephalic and atraumatic.  Cardiovascular:     Rate and Rhythm: Normal rate and regular rhythm.  Pulmonary:     Effort: Pulmonary effort is normal. No respiratory distress.     Breath sounds: Normal breath sounds. No wheezing or rales.  Abdominal:     General: Bowel sounds are normal. There is no distension.     Palpations: Abdomen is soft.     Tenderness: There is no abdominal tenderness. There is no rebound.  Musculoskeletal:     Cervical back: Normal range of motion.  Skin:    General: Skin is warm and dry.  Neurological:     Mental Status: He is alert and oriented to person, place, and time.     Coordination: Coordination normal.     Vitals:   06/22/20 1308  BP: 122/76  Pulse: 66  Temp: 98.5 F (36.9 C)  TempSrc: Oral  SpO2: 97%  Weight: 187 lb (84.8 kg)  Height: 5\' 11"  (1.803 m)    This visit occurred during the SARS-CoV-2 public health emergency.  Safety protocols were in place, including screening questions prior to the visit, additional usage of staff PPE, and extensive cleaning of exam room while observing appropriate contact time as indicated for disinfecting solutions.   Assessment & Plan:  Prevnar  13 given at visit

## 2020-06-23 LAB — CBC
HCT: 50.3 % — ABNORMAL HIGH (ref 38.5–50.0)
Hemoglobin: 17 g/dL (ref 13.2–17.1)
MCH: 31.1 pg (ref 27.0–33.0)
MCHC: 33.8 g/dL (ref 32.0–36.0)
MCV: 92.1 fL (ref 80.0–100.0)
MPV: 9.5 fL (ref 7.5–12.5)
Platelets: 254 10*3/uL (ref 140–400)
RBC: 5.46 10*6/uL (ref 4.20–5.80)
RDW: 12.8 % (ref 11.0–15.0)
WBC: 7 10*3/uL (ref 3.8–10.8)

## 2020-06-23 LAB — COMPREHENSIVE METABOLIC PANEL
AG Ratio: 1.7 (calc) (ref 1.0–2.5)
ALT: 14 U/L (ref 9–46)
AST: 17 U/L (ref 10–35)
Albumin: 4.9 g/dL (ref 3.6–5.1)
Alkaline phosphatase (APISO): 63 U/L (ref 35–144)
BUN: 21 mg/dL (ref 7–25)
CO2: 28 mmol/L (ref 20–32)
Calcium: 10.1 mg/dL (ref 8.6–10.3)
Chloride: 98 mmol/L (ref 98–110)
Creat: 1.12 mg/dL (ref 0.70–1.25)
Globulin: 2.9 g/dL (calc) (ref 1.9–3.7)
Glucose, Bld: 86 mg/dL (ref 65–99)
Potassium: 4.1 mmol/L (ref 3.5–5.3)
Sodium: 137 mmol/L (ref 135–146)
Total Bilirubin: 0.6 mg/dL (ref 0.2–1.2)
Total Protein: 7.8 g/dL (ref 6.1–8.1)

## 2020-06-23 LAB — LIPID PANEL
Cholesterol: 262 mg/dL — ABNORMAL HIGH (ref <200)
HDL: 61 mg/dL (ref >=40)
LDL Cholesterol (Calc): 174 mg/dL (calc) — ABNORMAL HIGH
Non-HDL Cholesterol (Calc): 201 mg/dL (calc) — ABNORMAL HIGH (ref <130)
Total CHOL/HDL Ratio: 4.3 (calc) (ref <5.0)
Triglycerides: 135 mg/dL (ref <150)

## 2020-10-10 ENCOUNTER — Ambulatory Visit (INDEPENDENT_AMBULATORY_CARE_PROVIDER_SITE_OTHER): Payer: BC Managed Care – PPO

## 2020-10-10 ENCOUNTER — Telehealth: Payer: Self-pay | Admitting: Internal Medicine

## 2020-10-10 ENCOUNTER — Other Ambulatory Visit: Payer: Self-pay

## 2020-10-10 ENCOUNTER — Encounter: Payer: Self-pay | Admitting: Internal Medicine

## 2020-10-10 ENCOUNTER — Ambulatory Visit: Payer: BC Managed Care – PPO | Admitting: Internal Medicine

## 2020-10-10 VITALS — BP 126/82 | HR 60 | Temp 98.2°F | Ht 71.0 in | Wt 190.0 lb

## 2020-10-10 DIAGNOSIS — R10819 Abdominal tenderness, unspecified site: Secondary | ICD-10-CM

## 2020-10-10 DIAGNOSIS — N400 Enlarged prostate without lower urinary tract symptoms: Secondary | ICD-10-CM | POA: Diagnosis not present

## 2020-10-10 DIAGNOSIS — Z0000 Encounter for general adult medical examination without abnormal findings: Secondary | ICD-10-CM | POA: Diagnosis not present

## 2020-10-10 DIAGNOSIS — R109 Unspecified abdominal pain: Secondary | ICD-10-CM | POA: Diagnosis not present

## 2020-10-10 DIAGNOSIS — Z1211 Encounter for screening for malignant neoplasm of colon: Secondary | ICD-10-CM

## 2020-10-10 LAB — CBC WITH DIFFERENTIAL/PLATELET
Basophils Absolute: 0 10*3/uL (ref 0.0–0.1)
Basophils Relative: 0.8 % (ref 0.0–3.0)
Eosinophils Absolute: 0.1 10*3/uL (ref 0.0–0.7)
Eosinophils Relative: 1 % (ref 0.0–5.0)
HCT: 47.5 % (ref 39.0–52.0)
Hemoglobin: 16 g/dL (ref 13.0–17.0)
Lymphocytes Relative: 21.3 % (ref 12.0–46.0)
Lymphs Abs: 1.4 10*3/uL (ref 0.7–4.0)
MCHC: 33.7 g/dL (ref 30.0–36.0)
MCV: 93.9 fl (ref 78.0–100.0)
Monocytes Absolute: 0.6 10*3/uL (ref 0.1–1.0)
Monocytes Relative: 9.5 % (ref 3.0–12.0)
Neutro Abs: 4.3 10*3/uL (ref 1.4–7.7)
Neutrophils Relative %: 67.4 % (ref 43.0–77.0)
Platelets: 247 10*3/uL (ref 150.0–400.0)
RBC: 5.06 Mil/uL (ref 4.22–5.81)
RDW: 13.7 % (ref 11.5–15.5)
WBC: 6.4 10*3/uL (ref 4.0–10.5)

## 2020-10-10 LAB — BASIC METABOLIC PANEL
BUN: 16 mg/dL (ref 6–23)
CO2: 28 mEq/L (ref 19–32)
Calcium: 9.2 mg/dL (ref 8.4–10.5)
Chloride: 100 mEq/L (ref 96–112)
Creatinine, Ser: 1.05 mg/dL (ref 0.40–1.50)
GFR: 74.21 mL/min (ref >60.00)
Glucose, Bld: 88 mg/dL (ref 70–99)
Potassium: 4.1 mEq/L (ref 3.5–5.1)
Sodium: 137 mEq/L (ref 135–145)

## 2020-10-10 LAB — PSA: PSA: 2.61 ng/mL (ref 0.10–4.00)

## 2020-10-10 NOTE — Telephone Encounter (Signed)
Patient has an appointment 10/10/2020 with Dr. Yetta Barre.

## 2020-10-10 NOTE — Patient Instructions (Signed)
Flank Pain, Adult Flank pain is pain that is located on the side of the body between the upper abdomen and the back. This area is called the flank. The pain may occur over a short period of time (acute), or it may be long-term or recurring (chronic). It may be mild or severe. Flank pain can be caused by many things, including:  Muscle soreness or injury.  Kidney stones or kidney disease.  Stress.  A disease of the spine (vertebral disk disease).  A lung infection (pneumonia).  Fluid around the lungs (pulmonary edema).  A skin rash caused by the chickenpox virus (shingles).  Tumors that affect the back of the abdomen.  Gallbladder disease. Follow these instructions at home:   Drink enough fluid to keep your urine clear or pale yellow.  Rest as told by your health care provider.  Take over-the-counter and prescription medicines only as told by your health care provider.  Keep a journal to track what has caused your flank pain and what has made it feel better.  Keep all follow-up visits as told by your health care provider. This is important. Contact a health care provider if:  Your pain is not controlled with medicine.  You have new symptoms.  Your pain gets worse.  You have a fever.  Your symptoms last longer than 2-3 days.  You have trouble urinating or you are urinating very frequently. Get help right away if:  You have trouble breathing or you are short of breath.  Your abdomen hurts or it is swollen or red.  You have nausea or vomiting.  You feel faint or you pass out.  You have blood in your urine. Summary  Flank pain is pain that is located on the side of the body between the upper abdomen and the back.  The pain may occur over a short period of time (acute), or it may be long-term or recurring (chronic). It may be mild or severe.  Flank pain can be caused by many things.  Contact your health care provider if your symptoms get worse or they last  longer than 2-3 days. This information is not intended to replace advice given to you by your health care provider. Make sure you discuss any questions you have with your health care provider. Document Revised: 10/24/2017 Document Reviewed: 01/24/2017 Elsevier Patient Education  2020 [employer] Inc[employer]  

## 2020-10-10 NOTE — Telephone Encounter (Signed)
Patient called and states that he is having pain in his side. Thought it might be a pulled muscle. Friday he was eating lunch and put too much cheese in his salad. Started having abdominal pain that has not gone away. Called the patient a left a voicemail to schedule an appointment with a provider.

## 2020-10-10 NOTE — Progress Notes (Signed)
Subjective:  Patient ID: Leonard Soto, male    DOB: September 24, 1956  Age: 64 y.o. MRN: [id number]  CC: Abdominal Pain and Annual Exam  This visit occurred during the SARS-CoV-2 public health emergency.  Safety protocols were in place, including screening questions prior to the visit, additional usage of staff PPE, and extensive cleaning of exam room while observing appropriate contact time as indicated for disinfecting solutions.    HPI Leonard Soto presents for a CPX.  He complains of a 4-day history of discomfort in his left lateral flank.  He describes it as an achy sensation and feels like a pulled muscle.  He needs a note for work.  He denies nausea, vomiting, fever, chills, rash, lymphadenopathy, dysuria, hematuria, loss of appetite, or weight loss.  Outpatient Medications Prior to Visit  Medication Sig Dispense Refill  . atorvastatin (LIPITOR) 20 MG tablet Take 1 tablet (20 mg total) by mouth daily. 90 tablet 3   No facility-administered medications prior to visit.    ROS Review of Systems  Constitutional: Negative.  Negative for appetite change, diaphoresis, fatigue, fever and unexpected weight change.  HENT: Negative.  Negative for trouble swallowing.   Eyes: Negative for visual disturbance.  Respiratory: Negative for cough, chest tightness, shortness of breath and wheezing.   Cardiovascular: Negative for chest pain, palpitations and leg swelling.  Gastrointestinal: Negative for abdominal pain, constipation, diarrhea, nausea and vomiting.  Genitourinary: Positive for flank pain. Negative for difficulty urinating, dysuria, hematuria, testicular pain and urgency.  Musculoskeletal: Negative for arthralgias and myalgias.  Skin: Negative.  Negative for color change and pallor.  Neurological: Negative.  Negative for dizziness, weakness, light-headedness and headaches.  Hematological: Negative for adenopathy. Does not bruise/bleed easily.  Psychiatric/Behavioral: Negative.      Objective:  BP 126/82   Pulse 60   Temp 98.2 F (36.8 C) (Oral)   Ht 5\' 11"  (1.803 m)   Wt 190 lb (86.2 kg)   SpO2 98%   BMI 26.50 kg/m   BP Readings from Last 3 Encounters:  10/10/20 126/82  06/22/20 122/76  10/08/18 110/70    Wt Readings from Last 3 Encounters:  10/10/20 190 lb (86.2 kg)  06/22/20 187 lb (84.8 kg)  10/08/18 190 lb (86.2 kg)    Physical Exam Vitals reviewed. Exam conducted with a chaperone present (his wife).  Constitutional:      General: He is not in acute distress.    Appearance: Normal appearance. He is not ill-appearing, toxic-appearing or diaphoretic.  HENT:     Nose: Nose normal.     Mouth/Throat:     Mouth: Mucous membranes are moist.     Pharynx: Oropharynx is clear.  Eyes:     General: No scleral icterus.    Conjunctiva/sclera: Conjunctivae normal.  Cardiovascular:     Rate and Rhythm: Normal rate and regular rhythm.     Heart sounds: No murmur heard.   Pulmonary:     Effort: Pulmonary effort is normal.     Breath sounds: No stridor. No wheezing, rhonchi or rales.  Abdominal:     General: Abdomen is flat. Bowel sounds are normal. There is no distension.     Palpations: Abdomen is soft. There is no hepatomegaly, splenomegaly, mass or pulsatile mass.     Tenderness: There is abdominal tenderness. There is no right CVA tenderness or left CVA tenderness.     Hernia: No hernia is present. There is no hernia in the left inguinal area or right inguinal area.  Comments: Mild ttp in the left flank  Genitourinary:    Pubic Area: No rash.      Penis: Normal. No swelling or lesions.      Testes: Normal.        Right: Mass, tenderness or swelling not present.        Left: Mass, tenderness or swelling not present.     Epididymis:     Right: Normal. No mass or tenderness.     Left: No mass or tenderness.     Prostate: Enlarged (1+ smooth symm BPH). Not tender and no nodules present.     Rectum: Normal. Guaiac result negative. No  mass, tenderness, anal fissure, external hemorrhoid or internal hemorrhoid. Normal anal tone.  Musculoskeletal:        General: Normal range of motion.     Cervical back: Neck supple.     Right lower leg: No edema.     Left lower leg: No edema.  Lymphadenopathy:     Cervical: No cervical adenopathy.     Lower Body: No right inguinal adenopathy. No left inguinal adenopathy.  Skin:    General: Skin is warm and dry.     Coloration: Skin is not pale.     Findings: No rash.  Neurological:     General: No focal deficit present.     Mental Status: He is alert. Mental status is at baseline.     Lab Results  Component Value Date   WBC 6.4 10/10/2020   HGB 16.0 10/10/2020   HCT 47.5 10/10/2020   PLT 247.0 10/10/2020   GLUCOSE 88 10/10/2020   CHOL 262 (H) 06/22/2020   TRIG 135 06/22/2020   HDL 61 06/22/2020   LDLDIRECT 194.6 03/03/2008   LDLCALC 174 (H) 06/22/2020   ALT 14 06/22/2020   AST 17 06/22/2020   NA 137 10/10/2020   K 4.1 10/10/2020   CL 100 10/10/2020   CREATININE 1.05 10/10/2020   BUN 16 10/10/2020   CO2 28 10/10/2020   TSH 3.10 03/03/2008   PSA 2.61 10/10/2020   HGBA1C 5.9 02/20/2017    DG Lumbar Spine Complete  Result Date: 08/18/2012 *RADIOLOGY REPORT* Clinical Data: Low back pain post fall LUMBAR SPINE - COMPLETE 4+ VIEW Comparison: None. Findings: Five views of the lumbar spine submitted.  No acute fracture or subluxation.  Anterior spurring noted at L3-L4 level and L4-L5 level.  Mild disc space flattening at L4-L5 and L5-S1 level.  Large left lateral osteophytes noted at L2-L3 and L3-L4 level.  Minimal levoscoliosis. IMPRESSION: No acute fracture or subluxation.  Degenerative changes as described above. Original Report Authenticated By: Natasha Mead, M.D.   No results found.  Assessment & Plan:   Leonard Soto was seen today for abdominal pain and annual exam.  Diagnoses and all orders for this visit:  Routine general medical examination at a health care facility-  Exam completed, labs reviewed, he refused all vaccines today, he is referred for colon cancer screening, patient education was given.  Abdominal tenderness in left flank- He has a vague discomfort in his left flank with no systemic symptoms.  His x-ray and labs are all reassuring.  I agreed that this is likely a musculoskeletal strain.  If his symptoms do not resolve or if they worsen or if he develops new symptoms and will consider ordering a CT scan of the area. -     DG ABD ACUTE 2+V W 1V CHEST; Future -     CBC with Differential/Platelet; Future -  Basic metabolic panel; Future -     Urinalysis, Routine w reflex microscopic; Future -     Urinalysis, Routine w reflex microscopic -     Basic metabolic panel -     CBC with Differential/Platelet  Benign prostatic hyperplasia without lower urinary tract symptoms- His PSA is low which is a reassuring sign that he does not have prostate cancer.  He has no symptoms that need to be treated. -     PSA; Future -     PSA  Colon cancer screening -     Cologuard   I am having Leonard Soto maintain his atorvastatin.  No orders of the defined types were placed in this encounter.    Follow-up: Return in about 3 weeks (around 10/31/2020).  Sanda Linger, MD

## 2020-10-11 LAB — URINALYSIS, ROUTINE W REFLEX MICROSCOPIC
Bilirubin Urine: NEGATIVE
Hgb urine dipstick: NEGATIVE
Leukocytes,Ua: NEGATIVE
Nitrite: NEGATIVE
RBC / HPF: NONE SEEN (ref 0–?)
Specific Gravity, Urine: >=1.03 — AB (ref 1.000–1.030)
Total Protein, Urine: NEGATIVE
Urine Glucose: NEGATIVE
Urobilinogen, UA: 0.2 (ref 0.0–1.0)
pH: 5.5 (ref 5.0–8.0)

## 2020-10-12 ENCOUNTER — Telehealth: Payer: Self-pay | Admitting: Internal Medicine

## 2020-10-12 NOTE — Telephone Encounter (Signed)
Patient did not answer.  

## 2020-10-12 NOTE — Telephone Encounter (Signed)
Patient states he is feeling better and saw that his labs were normal but was wondering what the abdominal pain could've came from. Patient # [phone]

## 2020-10-16 DIAGNOSIS — Z1211 Encounter for screening for malignant neoplasm of colon: Secondary | ICD-10-CM | POA: Diagnosis not present

## 2020-10-28 LAB — COLOGUARD
Cologuard: POSITIVE — AB
Cologuard: POSITIVE — AB

## 2020-11-01 ENCOUNTER — Other Ambulatory Visit: Payer: Self-pay | Admitting: Internal Medicine

## 2020-11-01 DIAGNOSIS — R195 Other fecal abnormalities: Secondary | ICD-10-CM

## 2020-12-11 ENCOUNTER — Encounter: Payer: Self-pay | Admitting: Internal Medicine

## 2020-12-11 NOTE — Telephone Encounter (Signed)
error 

## 2020-12-12 ENCOUNTER — Other Ambulatory Visit: Payer: Self-pay

## 2020-12-12 ENCOUNTER — Telehealth: Payer: Self-pay | Admitting: Internal Medicine

## 2020-12-12 ENCOUNTER — Encounter: Payer: Self-pay | Admitting: Internal Medicine

## 2020-12-12 ENCOUNTER — Telehealth (INDEPENDENT_AMBULATORY_CARE_PROVIDER_SITE_OTHER): Payer: BC Managed Care – PPO | Admitting: Internal Medicine

## 2020-12-12 DIAGNOSIS — J029 Acute pharyngitis, unspecified: Secondary | ICD-10-CM | POA: Diagnosis not present

## 2020-12-12 MED ORDER — LIDOCAINE VISCOUS HCL 2 % MT SOLN: 15.0000 mL | OROMUCOSAL | 0 refills | Status: DC | PRN

## 2020-12-12 MED ORDER — AMOXICILLIN-POT CLAVULANATE 875-125 MG PO TABS: 1.0000 | ORAL_TABLET | Freq: Two times a day (BID) | ORAL | 0 refills | Status: AC

## 2020-12-12 NOTE — Telephone Encounter (Signed)
I don't see any question here or is this just fyi?

## 2020-12-12 NOTE — Telephone Encounter (Signed)
Spoke with the patient and he stated he tested positive for covid. He stated that he swabbed the inside his nose and his mouth. He stated he swabbed his wife the same way and she came back negative. He stated the only symptom he is currently having is a sore throat. No fever at present. His current temp is 98.4. He stated he would call our office back if anything changed.

## 2020-12-12 NOTE — Assessment & Plan Note (Signed)
Concern for strep, negative covid-19 test per patient. Advised to take augmentin and use lidocaine viscous for pain. Work note done.

## 2020-12-12 NOTE — Telephone Encounter (Signed)
Patient called and said he tested positive for Covid 19 on 12-12-20. He said that his wife is a high risk patient and is requesting a call back.

## 2020-12-12 NOTE — Progress Notes (Signed)
Virtual Visit via Video Note  I connected with Leonard Soto on 12/12/20 at  8:00 AM EST by a video enabled telemedicine application and verified that I am speaking with the correct person using two identifiers.  The patient and the provider were at separate locations throughout the entire encounter. Patient location: home, Provider location: work   I discussed the limitations of evaluation and management by telemedicine and the availability of in person appointments. The patient expressed understanding and agreed to proceed. The patient and the provider were the only parties present for the visit unless noted in HPI below.  History of Present Illness: The patient is a 65 y.o. man with visit for sore throat and cough. Started Saturday. Had negative covid-19 test. Has fevers and sore throat and cough. Fever last night most recent to 101 F. Denies sick contacts. Overall it is stable. Has tried nothing for it.  [email or web address]  Observations/Objective: Appearance: normal, breathing appears normal, no coughing during visit, no dyspnea speaking in full sentences, casual grooming, abdomen does not appear distended, throat not visualized, mental status is A and O times 3  Assessment and Plan: See problem oriented charting  Follow Up Instructions: work note done, rx augmentin and lidocaine viscous  I discussed the assessment and treatment plan with the patient. The patient was provided an opportunity to ask questions and all were answered. The patient agreed with the plan and demonstrated an understanding of the instructions.   The patient was advised to call back or seek an in-person evaluation if the symptoms worsen or if the condition fails to improve as anticipated.  Myrlene Broker, MD

## 2021-02-23 DIAGNOSIS — Z0279 Encounter for issue of other medical certificate: Secondary | ICD-10-CM

## 2021-08-01 IMAGING — DX DG ABDOMEN ACUTE W/ 1V CHEST
4 series · 4 of 4 positions shown · non-contrast
Comparison: None.

CLINICAL DATA: Left flank pain for 7 days.

EXAM:
DG ABDOMEN ACUTE WITH 1 VIEW CHEST

[abdomen supine]
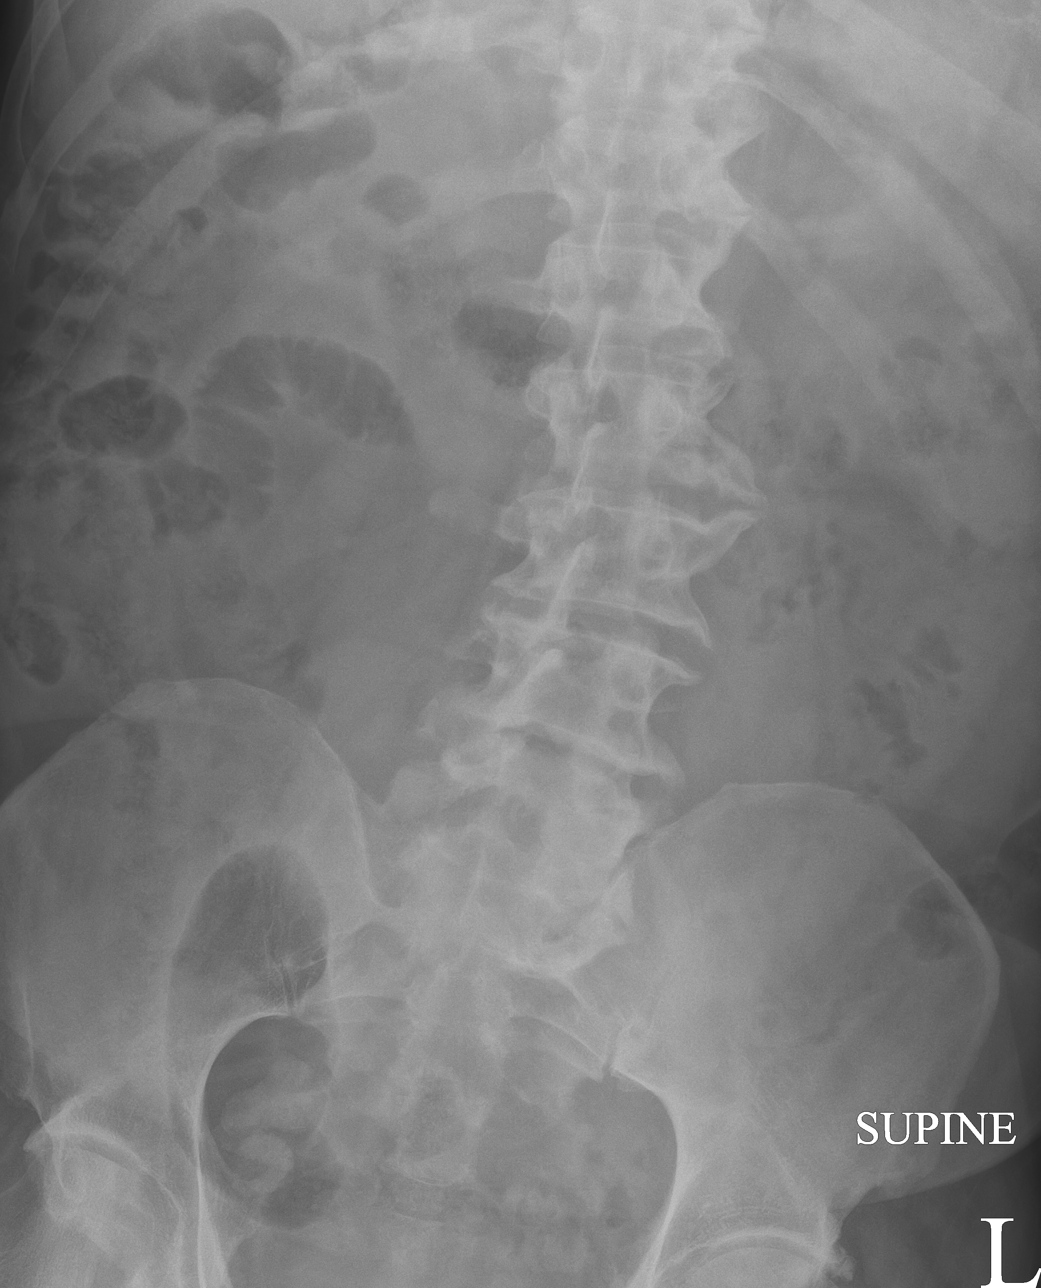

[abdomen erect]
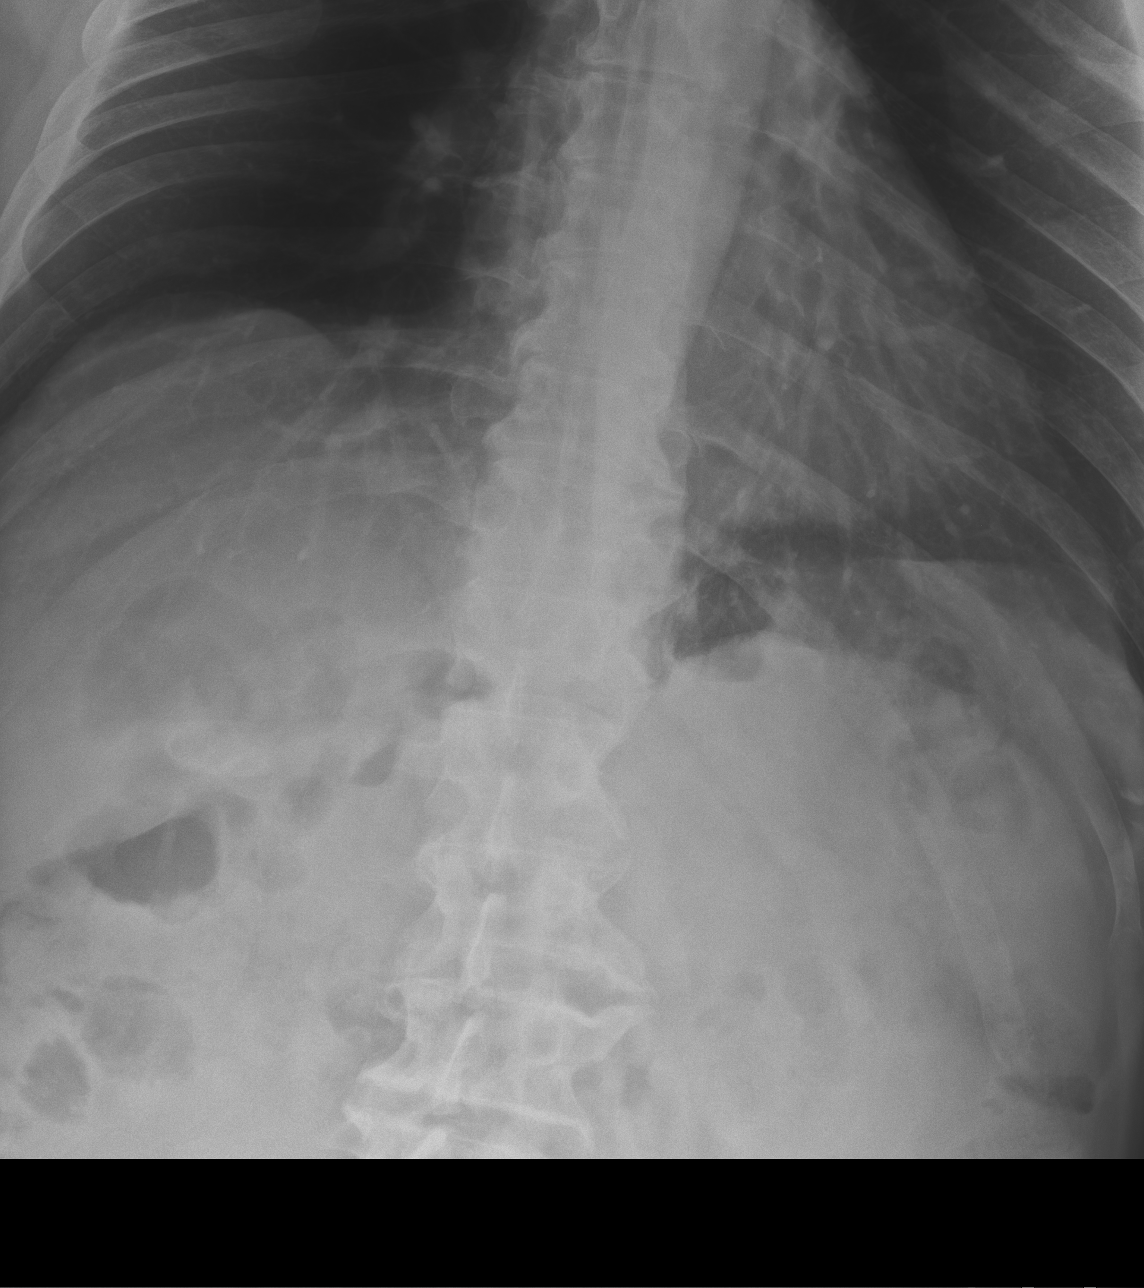

[chest pa (1 of 2)]
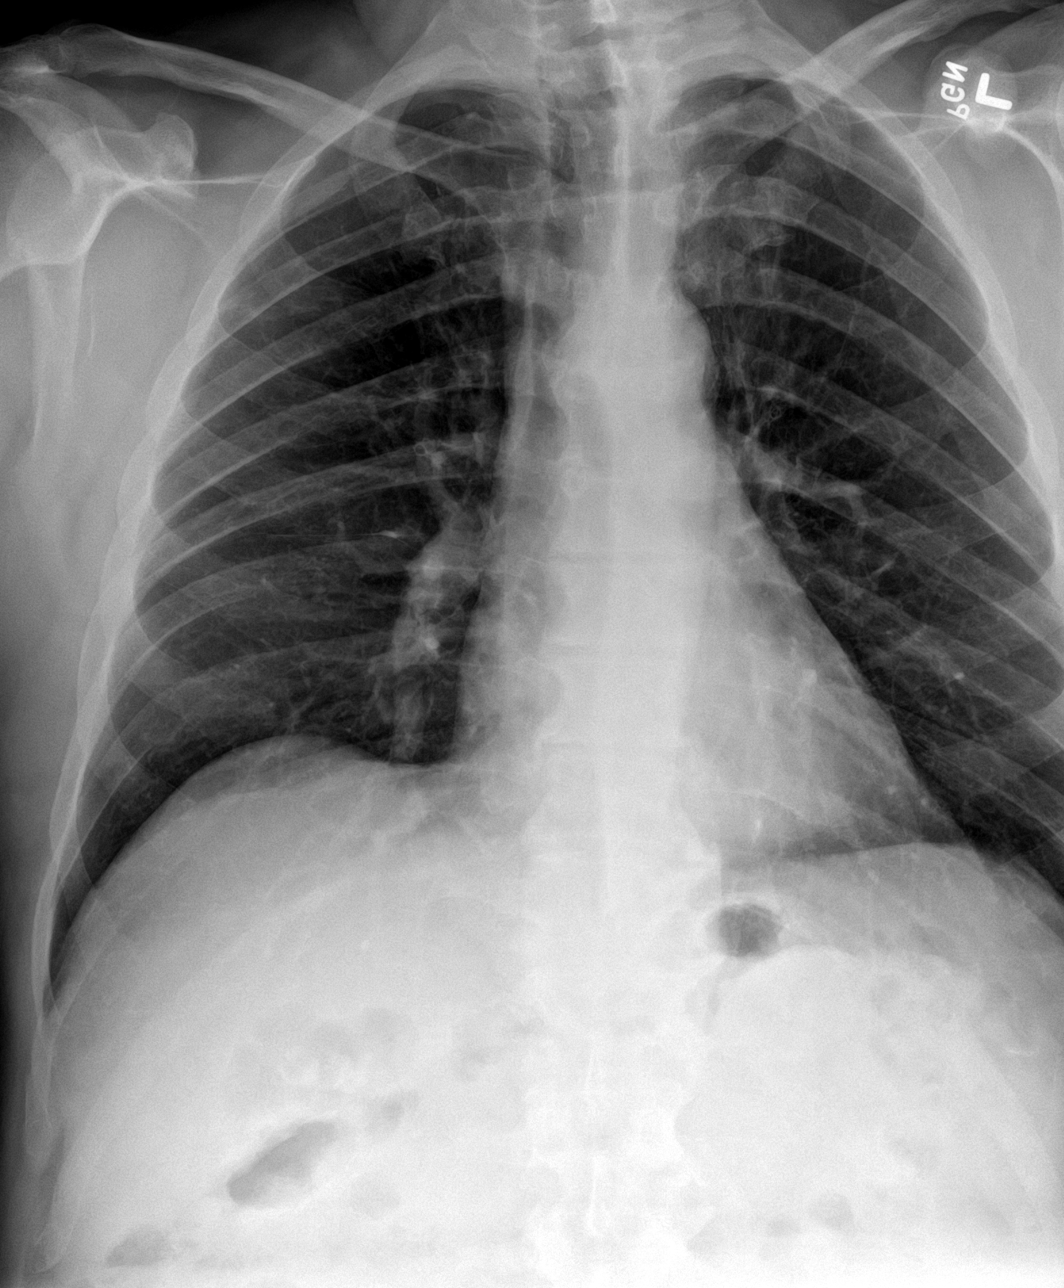

[chest pa (2 of 2)]
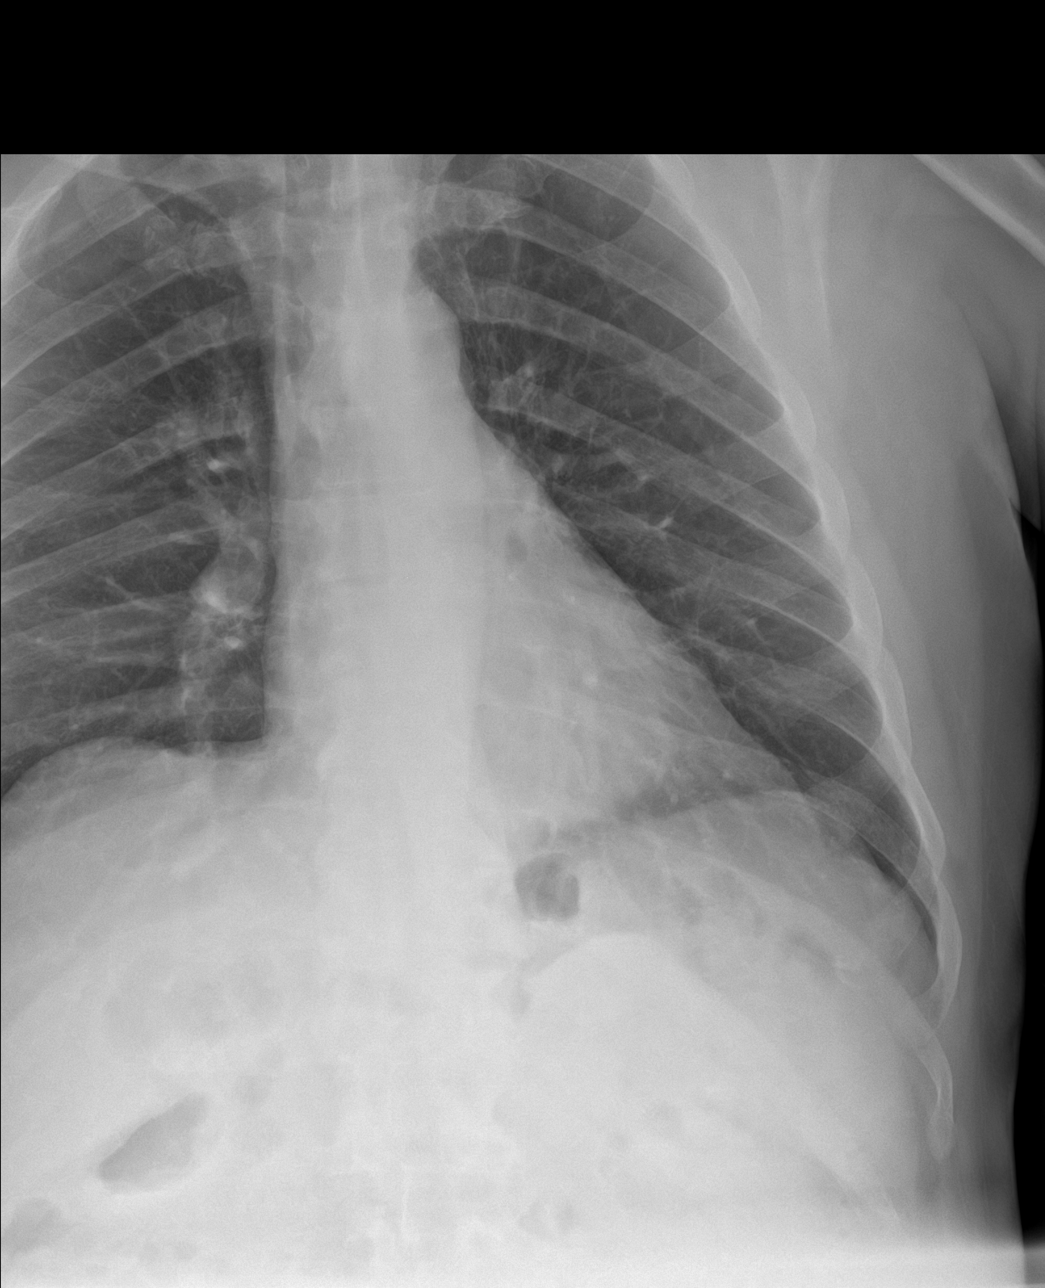

[4 of 4 positions shown; findings below may reference images not displayed]

FINDINGS: Frontal view of the chest demonstrates midline trachea. Normal heart
size and mediastinal contours. No pleural effusion or pneumothorax.
Clear lungs.

Abdominal films demonstrate no free intraperitoneal air or
significant air-fluid levels on upright positioning. No gaseous
distention of bowel loops on supine imaging. No abnormal abdominal
calcifications. No appendicolith. Low pelvis excluded.
IMPRESSION: No acute findings.

## 2021-10-30 ENCOUNTER — Encounter: Payer: Self-pay | Admitting: Internal Medicine

## 2021-10-30 ENCOUNTER — Ambulatory Visit (INDEPENDENT_AMBULATORY_CARE_PROVIDER_SITE_OTHER): Payer: BC Managed Care – PPO | Admitting: Internal Medicine

## 2021-10-30 ENCOUNTER — Other Ambulatory Visit: Payer: Self-pay

## 2021-10-30 VITALS — BP 134/80 | HR 71 | Resp 18 | Ht 71.0 in | Wt 195.6 lb

## 2021-10-30 DIAGNOSIS — Z0000 Encounter for general adult medical examination without abnormal findings: Secondary | ICD-10-CM | POA: Diagnosis not present

## 2021-10-30 DIAGNOSIS — R7303 Prediabetes: Secondary | ICD-10-CM | POA: Diagnosis not present

## 2021-10-30 DIAGNOSIS — Z636 Dependent relative needing care at home: Secondary | ICD-10-CM

## 2021-10-30 DIAGNOSIS — E782 Mixed hyperlipidemia: Secondary | ICD-10-CM

## 2021-10-30 NOTE — Progress Notes (Signed)
   Subjective:   Patient ID: Leonard Soto, male    DOB: 1956/08/23, 65 y.o.   MRN: [id number]  HPI The patient is a 65 YO man coming in for physical.   PMH, FMH, social history reviewed and updated  Review of Systems  Constitutional: Negative.   HENT: Negative.    Eyes: Negative.   Respiratory:  Negative for cough, chest tightness and shortness of breath.   Cardiovascular:  Negative for chest pain, palpitations and leg swelling.  Gastrointestinal:  Negative for abdominal distention, abdominal pain, constipation, diarrhea, nausea and vomiting.  Musculoskeletal: Negative.   Skin: Negative.   Neurological: Negative.   Psychiatric/Behavioral: Negative.     Objective:  Physical Exam Constitutional:      Appearance: He is well-developed.  HENT:     Head: Normocephalic and atraumatic.  Eyes:     Comments: Left eye blind  Cardiovascular:     Rate and Rhythm: Normal rate and regular rhythm.  Pulmonary:     Effort: Pulmonary effort is normal. No respiratory distress.     Breath sounds: Normal breath sounds. No wheezing or rales.  Abdominal:     General: Bowel sounds are normal. There is no distension.     Palpations: Abdomen is soft.     Tenderness: There is no abdominal tenderness. There is no rebound.  Musculoskeletal:     Cervical back: Normal range of motion.  Skin:    General: Skin is warm and dry.  Neurological:     Mental Status: He is alert and oriented to person, place, and time.     Coordination: Coordination abnormal.    Vitals:   10/30/21 1324  BP: 134/80  Pulse: 71  Resp: 18  SpO2: 95%  Weight: 195 lb 9.6 oz (88.7 kg)  Height: 5\' 11"  (1.803 m)    This visit occurred during the SARS-CoV-2 public health emergency.  Safety protocols were in place, including screening questions prior to the visit, additional usage of staff PPE, and extensive cleaning of exam room while observing appropriate contact time as indicated for disinfecting solutions.   Assessment &  Plan:

## 2021-10-30 NOTE — Patient Instructions (Addendum)
You should have a colonoscopy to follow up on the home colon testing.

## 2021-11-02 DIAGNOSIS — Z636 Dependent relative needing care at home: Secondary | ICD-10-CM | POA: Insufficient documentation

## 2021-11-02 DIAGNOSIS — R7303 Prediabetes: Secondary | ICD-10-CM | POA: Insufficient documentation

## 2021-11-02 NOTE — Assessment & Plan Note (Signed)
Taking lipitor 20 mg daily and recent labs with work.

## 2021-11-02 NOTE — Assessment & Plan Note (Signed)
Work HgA1c done 6.0 recently. Will need yearly recheck.

## 2021-11-02 NOTE — Assessment & Plan Note (Signed)
Flu shot up to date. Covid-19 booster encouraged. Pneumonia due declines. Shingrix declines. Tetanus declines. Colonoscopy due as positive cologuard 2021 but he declines today due to caretaking. Counseled about sun safety and mole surveillance. Counseled about the dangers of distracted driving. Given 10 year screening recommendations.

## 2021-11-02 NOTE — Assessment & Plan Note (Signed)
Wife has squamous cell cancer of the face and he is struggling with caring for her and trying to still work.

## 2021-11-06 ENCOUNTER — Telehealth: Payer: Self-pay | Admitting: Internal Medicine

## 2021-11-06 NOTE — Telephone Encounter (Signed)
1.Medication Requested: atorvastatin (LIPITOR) 20 MG tablet  2. Pharmacy (Name, Street, [place]):  3. On Med List: y  Pt requesting to have medication sent through [facility]. [facility], medication is about $500.00. Cannot add in pharmacy, as it wont pull from system. Pt did give fax # of [phone]. Phone #- [phone]    Agent: Please be advised that RX refills may take up to 3 business days. We ask that you follow-up with your pharmacy.

## 2021-11-06 NOTE — Telephone Encounter (Signed)
Called pt. LDVM at [phone] to discuss which pharmacy he would like his medication sent to,  unable to pull pharmacy up with information provided. Office number was provided.   If patient calls back please get the correct information for his pharmacy.

## 2021-11-07 NOTE — Telephone Encounter (Signed)
Patient called stating the Rx had not yet been received. Patient requesting for the nurse to call Santina Evans at [phone] at [address] [employer].

## 2021-11-13 MED ORDER — ATORVASTATIN CALCIUM 20 MG PO TABS
20.0000 mg | ORAL_TABLET | Freq: Every day | ORAL | 3 refills | Status: DC
Start: 2021-11-13 — End: 2024-07-12

## 2021-11-13 NOTE — Telephone Encounter (Signed)
Called Catherine at [place]. LDVM at [phone] for her to give our office a call to discuss his medication. Office number was provided

## 2021-11-13 NOTE — Telephone Encounter (Signed)
[facility] [address]  °

## 2021-11-13 NOTE — Telephone Encounter (Signed)
Please send the rx for the [facility]atin to [facility] [address] - it is free there

## 2021-11-13 NOTE — Telephone Encounter (Signed)
Pt called and states medication has still not been sent over. For he, nor his wife.    Advised pt that pharmacy can not be pulled up, he states to call [facility].

## 2021-11-13 NOTE — Addendum Note (Signed)
Addended by: Manuela Schwartz on: 11/13/2021 03:57 PM   Modules accepted: Orders

## 2022-02-15 ENCOUNTER — Encounter (HOSPITAL_BASED_OUTPATIENT_CLINIC_OR_DEPARTMENT_OTHER): Payer: Self-pay | Admitting: Obstetrics and Gynecology

## 2022-02-15 ENCOUNTER — Emergency Department (HOSPITAL_BASED_OUTPATIENT_CLINIC_OR_DEPARTMENT_OTHER)
Admission: EM | Admit: 2022-02-15 | Discharge: 2022-02-15 | Disposition: A | Discharge status: Home / Self Care | Payer: BC Managed Care – PPO | Attending: Emergency Medicine | Admitting: Emergency Medicine

## 2022-02-15 ENCOUNTER — Other Ambulatory Visit (HOSPITAL_BASED_OUTPATIENT_CLINIC_OR_DEPARTMENT_OTHER): Payer: Self-pay

## 2022-02-15 ENCOUNTER — Other Ambulatory Visit: Payer: Self-pay

## 2022-02-15 DIAGNOSIS — R112 Nausea with vomiting, unspecified: Secondary | ICD-10-CM | POA: Insufficient documentation

## 2022-02-15 DIAGNOSIS — R197 Diarrhea, unspecified: Secondary | ICD-10-CM | POA: Diagnosis not present

## 2022-02-15 DIAGNOSIS — E119 Type 2 diabetes mellitus without complications: Secondary | ICD-10-CM | POA: Insufficient documentation

## 2022-02-15 LAB — COMPREHENSIVE METABOLIC PANEL
ALT: 14 U/L (ref 0–44)
AST: 18 U/L (ref 15–41)
Albumin: 4.8 g/dL (ref 3.5–5.0)
Alkaline Phosphatase: 45 U/L (ref 38–126)
Anion gap: 11 (ref 5–15)
BUN: 32 mg/dL — ABNORMAL HIGH (ref 8–23)
CO2: 24 mmol/L (ref 22–32)
Calcium: 9.8 mg/dL (ref 8.9–10.3)
Chloride: 104 mmol/L (ref 98–111)
Creatinine, Ser: 1.3 mg/dL — ABNORMAL HIGH (ref 0.61–1.24)
GFR, Estimated: >60 mL/min (ref >60)
Glucose, Bld: 121 mg/dL — ABNORMAL HIGH (ref 70–99)
Potassium: 3.6 mmol/L (ref 3.5–5.1)
Sodium: 139 mmol/L (ref 135–145)
Total Bilirubin: 0.7 mg/dL (ref 0.3–1.2)
Total Protein: 7.9 g/dL (ref 6.5–8.1)

## 2022-02-15 LAB — CBC
HCT: 46.2 % (ref 39.0–52.0)
Hemoglobin: 15.3 g/dL (ref 13.0–17.0)
MCH: 30.8 pg (ref 26.0–34.0)
MCHC: 33.1 g/dL (ref 30.0–36.0)
MCV: 93 fL (ref 80.0–100.0)
Platelets: 230 10*3/uL (ref 150–400)
RBC: 4.97 MIL/uL (ref 4.22–5.81)
RDW: 13.3 % (ref 11.5–15.5)
WBC: 8.7 10*3/uL (ref 4.0–10.5)
nRBC: 0 % (ref 0.0–0.2)

## 2022-02-15 LAB — LIPASE, BLOOD: Lipase: <10 U/L — ABNORMAL LOW (ref 11–51)

## 2022-02-15 MED ORDER — ONDANSETRON HCL 4 MG/2ML IJ SOLN
4.0000 mg | Freq: Once | INTRAMUSCULAR | Status: AC | PRN
Start: 2022-02-15 — End: 2022-02-15
  Administered 2022-02-15: 4 mg via INTRAVENOUS
  Filled 2022-02-15: qty 2

## 2022-02-15 MED ORDER — LOPERAMIDE HCL 2 MG PO CAPS
4.0000 mg | ORAL_CAPSULE | Freq: Once | ORAL | Status: AC
  Administered 2022-02-15: 4 mg via ORAL
  Filled 2022-02-15: qty 2

## 2022-02-15 MED ORDER — SODIUM CHLORIDE 0.9 % IV BOLUS
1000.0000 mL | Freq: Once | INTRAVENOUS | Status: AC
  Administered 2022-02-15: 1000 mL via INTRAVENOUS

## 2022-02-15 NOTE — ED Notes (Addendum)
Assumed care from triage. Patient laying quietly on gurney. No acute distress noted. Patient updated on plan of care. Patient states nausea has improved since zofran administration. Will continue to monitor.  ? ?1215: Patient pending discharge. Awaiting completion of fluids. Patient made aware of plan of care.  ? ?

## 2022-02-15 NOTE — Discharge Instructions (Signed)
Call your primary care doctor or specialist as discussed in the next 2-3 days.   Return immediately back to the ER if:  Your symptoms worsen within the next 12-24 hours. You develop new symptoms such as new fevers, persistent vomiting, new pain, shortness of breath, or new weakness or numbness, or if you have any other concerns.  

## 2022-02-15 NOTE — ED Provider Notes (Signed)
?[facility] EM[facility] DEPT ?Provider Note ? ? ?CSN: [id number] ?Arrival date & time: 02/15/22  1017 ? ?  ? ?History ? ?Chief Complaint  ?Patient presents with  ? Emesis  ? ? ?Leonard Soto is a 66 y.o. male. ? ?Patient presents ER chief complaint of nausea vomiting diarrhea since yesterday.  He states it started after he ate a pizza.  Denies any blood in the vomiting or diarrhea.  Has intermittent abdominal pain but currently denies any pain.  Denies fevers or cough. ? ? ?  ? ?Home Medications ?Prior to Admission medications   ?Medication Sig Start Date End Date Taking? Authorizing Provider  ?atorvastatin (LIPITOR) 20 MG tablet Take 1 tablet (20 mg total) by mouth daily. 11/13/21   Myrlene Broker, MD  ?   ? ?Allergies    ?Hydrocodone-acetaminophen   ? ?Review of Systems   ?Review of Systems  ?Constitutional:  Negative for fever.  ?HENT:  Negative for ear pain and sore throat.   ?Eyes:  Negative for pain.  ?Respiratory:  Negative for cough.   ?Cardiovascular:  Negative for chest pain.  ?Gastrointestinal:  Positive for vomiting.  ?Genitourinary:  Negative for flank pain.  ?Musculoskeletal:  Negative for back pain.  ?Skin:  Negative for color change and rash.  ?Neurological:  Negative for syncope.  ?All other systems reviewed and are negative. ? ?Physical Exam ?Updated Vital Signs ?BP 112/75   Pulse 64   Temp 98.6 ?F (37 ?C) (Oral)   Resp 16   Ht 5\' 10"  (1.778 m)   Wt 86.2 kg   SpO2 96%   BMI 27.26 kg/m?  ?Physical Exam ?Constitutional:   ?   Appearance: He is well-developed.  ?HENT:  ?   Head: Normocephalic.  ?   Nose: Nose normal.  ?Eyes:  ?   Extraocular Movements: Extraocular movements intact.  ?Cardiovascular:  ?   Rate and Rhythm: Normal rate.  ?Pulmonary:  ?   Effort: Pulmonary effort is normal.  ?Abdominal:  ?   Tenderness: There is no abdominal tenderness. There is no guarding or rebound.  ?Skin: ?   Coloration: Skin is not jaundiced.  ?Neurological:  ?   Mental Status: He is alert.  Mental status is at baseline.  ? ? ?ED Results / Procedures / Treatments   ?Labs ?(all labs ordered are listed, but only abnormal results are displayed) ?Labs Reviewed  ?LIPASE, BLOOD - Abnormal; Notable for the following components:  ?    Result Value  ? Lipase <10 (*)   ? All other components within normal limits  ?COMPREHENSIVE METABOLIC PANEL - Abnormal; Notable for the following components:  ? Glucose, Bld 121 (*)   ? BUN 32 (*)   ? Creatinine, Ser 1.30 (*)   ? All other components within normal limits  ?CBC  ? ? ?EKG ?None ? ?Radiology ?No results found. ? ?Procedures ?Procedures  ? ? ?Medications Ordered in ED ?Medications  ?ondansetron (ZOFRAN) injection 4 mg (4 mg Intravenous Given 02/15/22 1050)  ?sodium chloride 0.9 % bolus 1,000 mL (1,000 mLs Intravenous New Bag/Given 02/15/22 1149)  ?loperamide (IMODIUM) capsule 4 mg (4 mg Oral Given 02/15/22 1146)  ? ? ?ED Course/ Medical Decision Making/ A&P ?  ?                        ?Medical Decision Making ?Amount and/or Complexity of Data Reviewed ?Labs: ordered. ? ?Risk ?Prescription drug management. ? ? ?Chart review shows primary care  office visit on October 30, 2021. ? ?History obtained from the patient and his son at bedside. ? ?Comorbidities influencing work-up today include history of diabetes. ? ?Labs are sent these are unremarkable chemistry CBC normal mild AKI noted.  Patient given Imodium and Zofran and a liter bolus of fluid subsequent improvement of symptoms no longer feeling nauseous or vomiting. ? ?Will be discharged home in stable condition.  Advised outpatient follow-up with his doctor this week.  Recommending immediate return if he cannot keep down any fluids or has any additional concerns.  Recommending BRAT diet with gradual advancement as symptoms improved. ? ? ? ? ? ? ? ?Final Clinical Impression(s) / ED Diagnoses ?Final diagnoses:  ?Vomiting and diarrhea  ? ? ?Rx / DC Orders ?ED Discharge Orders   ? ? None  ? ?  ? ? ?  ?Cheryll Cockayne,  MD ?02/15/22 1209 ? ?

## 2022-02-15 NOTE — ED Triage Notes (Signed)
Patient reports to the ER for nausea, diarrhea, and emesis. Patient reports he started vomiting yesterday at 5:30. Patient reports he believes he has food poisoning  ?

## 2022-05-22 DIAGNOSIS — M7662 Achilles tendinitis, left leg: Secondary | ICD-10-CM | POA: Diagnosis not present

## 2022-05-22 DIAGNOSIS — M76822 Posterior tibial tendinitis, left leg: Secondary | ICD-10-CM | POA: Diagnosis not present

## 2023-06-16 ENCOUNTER — Telehealth: Payer: Self-pay | Admitting: *Deleted

## 2023-06-16 NOTE — Telephone Encounter (Signed)
I connected with Leonard Soto on 7/22 at 1312 by telephone and verified that I am speaking with the correct person using two identifiers. According to the patient's chart they are due for physical with [facility]. Pt scheduled. There are no transportation issues at this time. Nothing further was needed at the end of our conversation.

## 2023-06-16 NOTE — Telephone Encounter (Signed)
I attempted to contact patient by telephone but was unsuccessful. According to the patient's chart they are due for physical with  [facility]. I have left a HIPAA compliant message advising the patient to contact [facility] at [phone]. I will continue to follow up with the patient to make sure this appointment is scheduled.

## 2023-07-07 ENCOUNTER — Encounter: Payer: Self-pay | Admitting: Internal Medicine

## 2023-07-07 ENCOUNTER — Ambulatory Visit (INDEPENDENT_AMBULATORY_CARE_PROVIDER_SITE_OTHER): Payer: PPO | Admitting: Internal Medicine

## 2023-07-07 VITALS — BP 120/80 | HR 67 | Temp 98.1°F | Ht 70.0 in | Wt 207.0 lb

## 2023-07-07 DIAGNOSIS — R7303 Prediabetes: Secondary | ICD-10-CM | POA: Diagnosis not present

## 2023-07-07 DIAGNOSIS — E782 Mixed hyperlipidemia: Secondary | ICD-10-CM | POA: Diagnosis not present

## 2023-07-07 DIAGNOSIS — Z0000 Encounter for general adult medical examination without abnormal findings: Secondary | ICD-10-CM | POA: Diagnosis not present

## 2023-07-07 LAB — LIPID PANEL
Cholesterol: 195 mg/dL (ref 0–200)
HDL: 44.9 mg/dL (ref >39.00)
LDL Cholesterol: 118 mg/dL — ABNORMAL HIGH (ref 0–99)
NonHDL: 150.38
Total CHOL/HDL Ratio: 4
Triglycerides: 161 mg/dL — ABNORMAL HIGH (ref 0.0–149.0)
VLDL: 32.2 mg/dL (ref 0.0–40.0)

## 2023-07-07 LAB — HEMOGLOBIN A1C: Hgb A1c MFr Bld: 6 % (ref 4.6–6.5)

## 2023-07-07 LAB — CBC
HCT: 47.9 % (ref 39.0–52.0)
Hemoglobin: 15.7 g/dL (ref 13.0–17.0)
MCHC: 32.7 g/dL (ref 30.0–36.0)
MCV: 95.5 fl (ref 78.0–100.0)
Platelets: 254 10*3/uL (ref 150.0–400.0)
RBC: 5.02 Mil/uL (ref 4.22–5.81)
RDW: 13.9 % (ref 11.5–15.5)
WBC: 6.1 10*3/uL (ref 4.0–10.5)

## 2023-07-07 LAB — COMPREHENSIVE METABOLIC PANEL
ALT: 17 U/L (ref 0–53)
AST: 17 U/L (ref 0–37)
Albumin: 4.5 g/dL (ref 3.5–5.2)
Alkaline Phosphatase: 51 U/L (ref 39–117)
BUN: 20 mg/dL (ref 6–23)
CO2: 27 mEq/L (ref 19–32)
Calcium: 9.8 mg/dL (ref 8.4–10.5)
Chloride: 103 mEq/L (ref 96–112)
Creatinine, Ser: 1.23 mg/dL (ref 0.40–1.50)
GFR: 60.21 mL/min (ref >60.00)
Glucose, Bld: 100 mg/dL — ABNORMAL HIGH (ref 70–99)
Potassium: 4.7 mEq/L (ref 3.5–5.1)
Sodium: 137 mEq/L (ref 135–145)
Total Bilirubin: 0.4 mg/dL (ref 0.2–1.2)
Total Protein: 7.4 g/dL (ref 6.0–8.3)

## 2023-07-07 LAB — MICROALBUMIN / CREATININE URINE RATIO
Creatinine,U: 136.8 mg/dL
Microalb Creat Ratio: 0.5 mg/g (ref 0.0–30.0)
Microalb, Ur: <0.7 mg/dL (ref 0.0–1.9)

## 2023-07-07 NOTE — Assessment & Plan Note (Signed)
Checking HgA1c and adjust as needed. He is up 20 pounds since retirement and loss of spouse.

## 2023-07-07 NOTE — Assessment & Plan Note (Signed)
Checking lipid panel and adjust lipitor 20 mg daily as needed. 

## 2023-07-07 NOTE — Assessment & Plan Note (Signed)
Flu shot yearly. Pneumonia declines. Shingrix declines. Tetanus declines. Colonoscopy declines. Counseled about sun safety and mole surveillance. Counseled about the dangers of distracted driving. Given 10 year screening recommendations.

## 2023-07-07 NOTE — Progress Notes (Signed)
Subjective:   Patient ID: Leonard Soto, male    DOB: 08-15-1956, 67 y.o.   MRN: [id number]  HPI Here for medicare wellness and physical, no new complaints. Please see A/P for status and treatment of chronic medical problems.   Diet: heart healthy Physical activity: sedentary Depression/mood screen: negative Hearing: intact to whispered voice Visual acuity: grossly normal right eye left eye blind, performs annual eye exam  ADLs: capable Fall risk: none Home safety: good Cognitive evaluation: intact to orientation, naming, recall and repetition EOL planning: adv directives discussed  Flowsheet Row Office Visit from 07/07/2023 in [facility]  PHQ-2 Total Score 0           02/15/2022   10:30 AM 07/07/2023    8:16 AM  Fall Risk  Falls in the past year?  0  Was there an injury with Fall?  0  Fall Risk Category Calculator  0  (RETIRED) Patient Fall Risk Level Low fall risk   Patient at Risk for Falls Due to  No Fall Risks  Fall risk Follow up  Falls evaluation completed    I have personally reviewed and have noted 1. The patient's medical and social history - reviewed today no changes 2. Their use of alcohol, tobacco or illicit drugs 3. Their current medications and supplements 4. The patient's functional ability including ADL's, fall risks, home safety risks and hearing or visual impairment. 5. Diet and physical activities 6. Evidence for depression or mood disorders 7. Care team reviewed and updated 8.  The patient is not on an opioid pain medication  Patient Care Team: Myrlene Broker, MD as PCP - General (Internal Medicine) Past Medical History:  Diagnosis Date   Hyperlipidemia    Past Surgical History:  Procedure Laterality Date   COLONOSCOPY  12/10/2006   Family History  Problem Relation Age of Onset   Hypertension Mother    COPD Mother    CVA Mother    Hypertension Father    Hyperlipidemia Father    Benign  prostatic hyperplasia Father    Review of Systems  Constitutional: Negative.   HENT: Negative.    Eyes: Negative.   Respiratory:  Negative for cough, chest tightness and shortness of breath.   Cardiovascular:  Negative for chest pain, palpitations and leg swelling.  Gastrointestinal:  Negative for abdominal distention, abdominal pain, constipation, diarrhea, nausea and vomiting.  Musculoskeletal:  Positive for arthralgias and gait problem.  Skin: Negative.   Psychiatric/Behavioral: Negative.      Objective:  Physical Exam Constitutional:      Appearance: He is well-developed.  HENT:     Head: Normocephalic and atraumatic.  Cardiovascular:     Rate and Rhythm: Normal rate and regular rhythm.  Pulmonary:     Effort: Pulmonary effort is normal. No respiratory distress.     Breath sounds: Normal breath sounds. No wheezing or rales.  Abdominal:     General: Bowel sounds are normal. There is no distension.     Palpations: Abdomen is soft.     Tenderness: There is no abdominal tenderness. There is no rebound.  Musculoskeletal:        General: Tenderness present.     Cervical back: Normal range of motion.  Skin:    General: Skin is warm and dry.  Neurological:     Mental Status: He is alert and oriented to person, place, and time.     Coordination: Coordination abnormal.     Vitals:  07/07/23 0816  BP: 120/80  Pulse: 67  Temp: 98.1 F (36.7 C)  TempSrc: Temporal  SpO2: 97%  Weight: 207 lb (93.9 kg)  Height: 5\' 10"  (1.778 m)    Assessment & Plan:

## 2024-03-31 ENCOUNTER — Encounter (HOSPITAL_COMMUNITY): Payer: Self-pay

## 2024-04-07 ENCOUNTER — Ambulatory Visit (INDEPENDENT_AMBULATORY_CARE_PROVIDER_SITE_OTHER)

## 2024-04-07 VITALS — BP 122/60 | HR 84 | Ht 65.0 in | Wt 210.4 lb

## 2024-04-07 DIAGNOSIS — Z1211 Encounter for screening for malignant neoplasm of colon: Secondary | ICD-10-CM

## 2024-04-07 DIAGNOSIS — Z0000 Encounter for general adult medical examination without abnormal findings: Secondary | ICD-10-CM

## 2024-04-07 NOTE — Patient Instructions (Signed)
 Mr. Leonard Soto , Thank you for taking time out of your busy schedule to complete your Annual Wellness Visit with me. I enjoyed our conversation and look forward to speaking with you again next year. I, as well as your care team,  appreciate your ongoing commitment to your health goals. Please review the following plan we discussed and let me know if I can assist you in the future. Your Game plan/ To Do List    Referrals: If you haven't heard from the office you've been referred to, please reach out to them at the phone provided.  Referral to [facility] for a Screening Colonoscopy.   Follow up Visits: Next Medicare AWV with our clinical staff: 04/14/2025   Have you seen your provider in the last 6 months (3 months if uncontrolled diabetes)? No Next Office Visit with your provider: 07/12/2024  Clinician Recommendations:  Aim for 30 minutes of exercise or brisk walking, 6-8 glasses of water, and 5 servings of fruits and vegetables each day. Educated and advised on getting the COVID, Tdap (Tetenus) and Pneumonia vaccines in 2025.      This is a list of the screening recommended for you and due dates:  Health Maintenance  Topic Date Due   Zoster (Shingles) Vaccine (1 of 2) 09/22/2004   Colon Cancer Screening  11/25/2016   DTaP/Tdap/Td vaccine (2 - Tdap) 01/26/2018   Pneumonia Vaccine (2 of 2 - PPSV23) 06/22/2021   COVID-19 Vaccine (2 - 2024-25 season) 07/27/2023   Flu Shot  06/25/2024   Medicare Annual Wellness Visit  04/07/2025   Hepatitis C Screening  Completed   HPV Vaccine  Aged Out   Meningitis B Vaccine  Aged Out   Cologuard (Stool DNA test)  Discontinued    Advanced directives: (Provided) Advance directive discussed with you today. I have provided a copy for you to complete at home and have notarized. Once this is complete, please bring a copy in to our office so we can scan it into your chart.  Advance Care Planning is important because it:  [x]  Makes sure you receive the medical care  that is consistent with your values, goals, and preferences  [x]  It provides guidance to your family and loved ones and reduces their decisional burden about whether or not they are making the right decisions based on your wishes.  Follow the link provided in your after visit summary or read over the paperwork we have mailed to you to help you started getting your Advance Directives in place. If you need assistance in completing these, please reach out to us  so that we can help you!

## 2024-04-07 NOTE — Progress Notes (Signed)
 Subjective:   Leonard Soto is a 68 y.o. who presents for a Medicare Wellness preventive visit.  As a reminder, Annual Wellness Visits don't include a physical exam, and some assessments may be limited, especially if this visit is performed virtually. We may recommend an in-person visit if needed.  Visit Complete: In person  Persons Participating in Visit: Patient.  AWV Questionnaire: No: Patient Medicare AWV questionnaire was not completed prior to this visit.  Cardiac Risk Factors include: advanced age (>63men, >40 women);dyslipidemia;male gender;obesity (BMI >30kg/m2)     Objective:     Today's Vitals   04/07/24 0807  BP: 122/60  Pulse: 84  SpO2: 96%  Weight: 210 lb 6.4 oz (95.4 kg)  Height: 5\' 5"  (1.651 m)   Body mass index is 35.01 kg/m.     04/07/2024    8:06 AM 02/15/2022   10:30 AM  Advanced Directives  Does Patient Have a Medical Advance Directive? Yes No  Type of Estate agent of [place];Living will   Copy of Healthcare Power of Attorney in Chart? No - copy requested   Would patient like information on creating a medical advance directive?  No - Patient declined    Current Medications (verified) Outpatient Encounter Medications as of 04/07/2024  Medication Sig   atorvastatin  (LIPITOR) 20 MG tablet Take 1 tablet (20 mg total) by mouth daily.   No facility-administered encounter medications on file as of 04/07/2024.    Allergies (verified) Hydrocodone -acetaminophen   History: Past Medical History:  Diagnosis Date   Hyperlipidemia    Past Surgical History:  Procedure Laterality Date   COLONOSCOPY  12/10/2006   Family History  Problem Relation Age of Onset   Hypertension Mother    COPD Mother    CVA Mother    Hypertension Father    Hyperlipidemia Father    Benign prostatic hyperplasia Father    Social History   Socioeconomic History   Marital status: Widowed    Spouse name: Not on file   Number of children: Not on  file   Years of education: Not on file   Highest education level: Not on file  Occupational History   Not on file  Tobacco Use   Smoking status: Never   Smokeless tobacco: Never  Vaping Use   Vaping status: Not on file  Substance and Sexual Activity   Alcohol use: No   Drug use: No   Sexual activity: Not on file  Other Topics Concern   Not on file  Social History Narrative   [employer], [employer] - engineering degree   Married '79 - 15 yrs divorced ; '99   1 son '84 1 daughter '79 ; 5 grandchildren   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[employer], research (medical)    Social Drivers of Corporate investment banker Strain: Low Risk  (04/07/2024)   Overall Financial Resource Strain (CARDIA)    Difficulty of Paying Living Expenses: Not hard at all  Food Insecurity: No Food Insecurity (04/07/2024)   Hunger Vital Sign    Worried About Running Out of Food in the Last Year: Never true    Ran Out of Food in the Last Year: Never true  Transportation Needs: No Transportation Needs (04/07/2024)   PRAPARE - Administrator, Civil Service (Medical): No    Lack of Transportation (Non-Medical): No  Physical Activity: Sufficiently Active (04/07/2024)   Exercise Vital Sign    Days of Exercise per Week: 7 days  Minutes of Exercise per Session: 60 min  Stress: No Stress Concern Present (04/07/2024)   [employer] of Occupational Health - Occupational Stress Questionnaire    Feeling of Stress : Not at all  Social Connections: Socially Isolated (04/07/2024)   Social Connection and Isolation Panel [NHANES]    Frequency of Communication with Friends and Family: More than three times a week    Frequency of Social Gatherings with Friends and Family: Twice a week    Attends Religious Services: Never    Database administrator or Organizations: No    Attends Banker Meetings: Never    Marital Status: Widowed    Tobacco Counseling Counseling given: No    Clinical  Intake:  Pre-visit preparation completed: Yes  Pain : No/denies pain     BMI - recorded: 35.01 Nutritional Status: BMI > 30  Obese Nutritional Risks: None Diabetes: No  Lab Results  Component Value Date   HGBA1C 6.0 07/07/2023   HGBA1C 5.9 02/20/2017   HGBA1C 5.9 08/28/2015     How often do you need to have someone help you when you read instructions, pamphlets, or other written materials from your doctor or pharmacy?: 1 - Never  Interpreter Needed?: No  Information entered by :: Kandy Orris, CMA   Activities of Daily Living     04/07/2024    8:20 AM  In your present state of health, do you have any difficulty performing the following activities:  Hearing? 0  Vision? 0  Difficulty concentrating or making decisions? 0  Walking or climbing stairs? 0  Dressing or bathing? 0  Doing errands, shopping? 0  Preparing Food and eating ? N  Using the Toilet? N  In the past six months, have you accidently leaked urine? N  Do you have problems with loss of bowel control? N  Managing your Medications? N  Managing your Finances? N  Housekeeping or managing your Housekeeping? N    Patient Care Team: Adelia Homestead, MD as PCP - General (Internal Medicine) Ladon Pickler, MD as Referring Physician (Ophthalmology)  Indicate any recent Medical Services you may have received from other than [facility] providers in the past year (date may be approximate).     Assessment:    This is a routine wellness examination for Leonard Soto.  Hearing/Vision screen Hearing Screening - Comments:: Denies hearing difficulties   Vision Screening - Comments:: Wears eyeglasses for reading only   Goals Addressed               This Visit's Progress     Patient Stated (pt-stated)        Patient stated he wants to stay active.       Depression Screen     04/07/2024    8:25 AM 07/07/2023    8:18 AM 10/30/2021    1:25 PM 06/22/2020    1:25 PM 10/08/2018    1:24 PM  PHQ 2/9 Scores   PHQ - 2 Score 0 0 0 0 0  PHQ- 9 Score 0        Fall Risk     04/07/2024    8:21 AM 07/07/2023    8:16 AM  Fall Risk   Falls in the past year? 1 0  Number falls in past yr: 0 0  Comment 1   Injury with Fall? 0 0  Risk for fall due to :  No Fall Risks  Follow up Falls evaluation completed;Falls prevention discussed Falls evaluation completed  MEDICARE RISK AT HOME:  Medicare Risk at Home Any stairs in or around the home?: No If so, are there any without handrails?: No Home free of loose throw rugs in walkways, pet beds, electrical cords, etc?: Yes Adequate lighting in your home to reduce risk of falls?: Yes Life alert?: No Use of a cane, walker or w/c?: No Grab bars in the bathroom?: Yes Shower chair or bench in shower?: Yes Elevated toilet seat or a handicapped toilet?: Yes  TIMED UP AND GO:  Was the test performed?  No  Cognitive Function: 6CIT completed        04/07/2024    8:23 AM  6CIT Screen  What Year? 0 points  What month? 0 points  What time? 0 points  Count back from 20 0 points  Months in reverse 0 points  Repeat phrase 0 points  Total Score 0 points    Immunizations Immunization History  Administered Date(s) Administered   Fluad Quad(high Dose 65+) 08/30/2021   Influenza, High Dose Seasonal PF 11/13/2016   Influenza,inj,Quad PF,6+ Mos 08/22/2014   Influenza-Unspecified 08/26/2015, 08/24/2018, 09/18/2018   Janssen (J&J) SARS-COV-2 Vaccination 02/08/2020   Pneumococcal Conjugate-13 06/22/2020   Td 01/27/2008   Zoster, Live 08/29/2012    Screening Tests Health Maintenance  Topic Date Due   Zoster Vaccines- Shingrix (1 of 2) 09/22/2004   Colonoscopy  11/25/2016   DTaP/Tdap/Td (2 - Tdap) 01/26/2018   Pneumonia Vaccine 38+ Years old (2 of 2 - PPSV23) 06/22/2021   COVID-19 Vaccine (2 - 2024-25 season) 07/27/2023   INFLUENZA VACCINE  06/25/2024   Medicare Annual Wellness (AWV)  04/07/2025   Hepatitis C Screening  Completed   HPV VACCINES   Aged Out   Meningococcal B Vaccine  Aged Out   Fecal DNA (Cologuard)  Discontinued    Health Maintenance  Health Maintenance Due  Topic Date Due   Zoster Vaccines- Shingrix (1 of 2) 09/22/2004   Colonoscopy  11/25/2016   DTaP/Tdap/Td (2 - Tdap) 01/26/2018   Pneumonia Vaccine 68+ Years old (2 of 2 - PPSV23) 06/22/2021   COVID-19 Vaccine (2 - 2024-25 season) 07/27/2023   Health Maintenance Items Addressed: Referral sent to GI for colonoscopy  Additional Screening:  Vision Screening: Recommended annual ophthalmology exams for early detection of glaucoma and other disorders of the eye.  Dental Screening: Recommended annual dental exams for proper oral hygiene  Community Resource Referral / Chronic Care Management: CRR required this visit?  No   CCM required this visit?  No   Plan:    I have personally reviewed and noted the following in the patient's chart:   Medical and social history Use of alcohol, tobacco or illicit drugs  Current medications and supplements including opioid prescriptions. Patient is not currently taking opioid prescriptions. Functional ability and status Nutritional status Physical activity Advanced directives List of other physicians Hospitalizations, surgeries, and ER visits in previous 12 months Vitals Screenings to include cognitive, depression, and falls Referrals and appointments  In addition, I have reviewed and discussed with patient certain preventive protocols, quality metrics, and best practice recommendations. A written personalized care plan for preventive services as well as general preventive health recommendations were provided to patient.   Patria Bookbinder, CMA   04/07/2024   After Visit Summary: (In Person-Declined) Patient declined AVS at this time.  Notes: Pt prefers to discuss all due vaccines w/PCP at next appt

## 2024-06-10 ENCOUNTER — Encounter: Payer: Self-pay | Admitting: Internal Medicine

## 2024-07-12 ENCOUNTER — Ambulatory Visit (INDEPENDENT_AMBULATORY_CARE_PROVIDER_SITE_OTHER): Admitting: Internal Medicine

## 2024-07-12 ENCOUNTER — Encounter: Payer: Self-pay | Admitting: Internal Medicine

## 2024-07-12 VITALS — BP 136/80 | HR 54 | Temp 98.3°F | Ht 65.0 in | Wt 208.0 lb

## 2024-07-12 DIAGNOSIS — Z0000 Encounter for general adult medical examination without abnormal findings: Secondary | ICD-10-CM | POA: Diagnosis not present

## 2024-07-12 DIAGNOSIS — E782 Mixed hyperlipidemia: Secondary | ICD-10-CM | POA: Diagnosis not present

## 2024-07-12 DIAGNOSIS — N400 Enlarged prostate without lower urinary tract symptoms: Secondary | ICD-10-CM | POA: Diagnosis not present

## 2024-07-12 DIAGNOSIS — R7303 Prediabetes: Secondary | ICD-10-CM | POA: Diagnosis not present

## 2024-07-12 DIAGNOSIS — Z23 Encounter for immunization: Secondary | ICD-10-CM

## 2024-07-12 MED ORDER — ATORVASTATIN CALCIUM 20 MG PO TABS: 20.0000 mg | ORAL_TABLET | Freq: Every day | ORAL | 3 refills | Status: AC

## 2024-07-12 MED ORDER — PROMETHAZINE-DM 6.25-15 MG/5ML PO SYRP: 5.0000 mL | ORAL_SOLUTION | Freq: Four times a day (QID) | ORAL | 0 refills | Status: AC | PRN

## 2024-07-12 NOTE — Assessment & Plan Note (Signed)
 Checking HgA1c and adjust as needed.

## 2024-07-12 NOTE — Assessment & Plan Note (Signed)
 Flu shot yearly. Pneumonia given 20. Shingrix declines. Tetanus declines. Colonoscopy declines. Counseled about sun safety and mole surveillance. Counseled about the dangers of distracted driving. Given 10 year screening recommendations.

## 2024-07-12 NOTE — Progress Notes (Signed)
   Subjective:   Patient ID: Leonard Soto, male    DOB: 04-Jul-1956, 68 y.o.   MRN: [id number]  The patient is here for physical. Pertinent topics discussed: Discussed the use of AI scribe software for clinical note transcription with the patient, who gave verbal consent to proceed.  History of Present Illness Leonard Soto is a 68 year old male who presents with a persistent cough. He has a titanium steel leg that sometimes causes discomfort, waking him up at night. Despite this, he maintains a habit of waking up at 3 AM, which he attributes to his long-standing routine even after being retired for over two years.  PMH, [facility], social history reviewed and updated  Review of Systems  Constitutional: Negative.   HENT: Negative.    Eyes: Negative.   Respiratory:  Negative for cough, chest tightness and shortness of breath.   Cardiovascular:  Negative for chest pain, palpitations and leg swelling.  Gastrointestinal:  Negative for abdominal distention, abdominal pain, constipation, diarrhea, nausea and vomiting.  Musculoskeletal: Negative.   Skin: Negative.   Neurological: Negative.   Psychiatric/Behavioral: Negative.      Objective:  Physical Exam Constitutional:      Appearance: He is well-developed.  HENT:     Head: Normocephalic and atraumatic.  Cardiovascular:     Rate and Rhythm: Normal rate and regular rhythm.  Pulmonary:     Effort: Pulmonary effort is normal. No respiratory distress.     Breath sounds: Normal breath sounds. No wheezing or rales.  Abdominal:     General: Bowel sounds are normal. There is no distension.     Palpations: Abdomen is soft.     Tenderness: There is no abdominal tenderness. There is no rebound.  Musculoskeletal:     Cervical back: Normal range of motion.  Skin:    General: Skin is warm and dry.  Neurological:     Mental Status: He is alert and oriented to person, place, and time.     Coordination: Coordination normal.     Vitals:    07/12/24 0807  BP: 136/80  Pulse: (!) 54  Temp: 98.3 F (36.8 C)  TempSrc: Oral  SpO2: 98%  Weight: 208 lb (94.3 kg)  Height: 5' 5 (1.651 m)    Assessment & Plan:  Prevnar 20 given at visit

## 2024-07-12 NOTE — Assessment & Plan Note (Signed)
Stable and no progression 

## 2024-07-12 NOTE — Assessment & Plan Note (Signed)
 Checking lipid panel and CMP and CBC adjust lipitor 20 mg daily as needed.

## 2024-08-04 ENCOUNTER — Ambulatory Visit: Payer: Self-pay

## 2024-08-04 NOTE — Telephone Encounter (Signed)
 Message from [facility] C sent at 08/04/2024  8:25 AM EDT  Summary: coughing   Patient was prescribed promethazine -dextromethorphan (PROMETHAZINE -DM) 6.25-15 MG/5ML syrup on 8/18 during his visit stated it helped with the phlegm but when he leans back in his chair or lays down he has coughing spells that gets annoying at times, he's requesting an antibiotic to help with the cough          Nurse attempted to call pt: no answer: left voicemail asking pt to return our call.

## 2024-08-04 NOTE — Telephone Encounter (Signed)
 FYI Only or Action Required?: Action required by provider: clinical question for provider.  Patient was last seen in primary care on 07/12/2024 by Rollene Almarie LABOR, MD.  Called Nurse Triage reporting Cough. Pt is c/o a dry hacking cough at night making it hard to sleep.   Seeking advice.  Symptoms began about a month ago.  Interventions attempted: OTC medications: Robitussin DM   The Promethazine  DM did not help.  Symptoms are: unchanged.Coughing at night making it hard to sleep.   No shortness of breath or chest tightness.  Triage Disposition: Call PCP Now  Patient/caregiver understands and will follow disposition?: Yes  Seeking advice for the dry cough at night.  Reason for Disposition  Cough has been present for > 3 weeks  Answer Assessment - Initial Assessment Questions 1. ONSET: When did the cough begin?      Seen 07/12/2024 for coughing.  I've taken Robitussin DM and the mucus was coming up and I was doing better.   The Promethazine -DM did not help I think it made it worse. I'm coughing mostly at night.   I took the medicine.   I've taken Robitussin.   The mucus is almost gone.   I'm reclined in my recliner and I'm not coughing.   When I lay down I'm coughing a lot.   No drainage down back of throat.   Do I need an antibiotic?   I've got to go to [place]  for 2 weeks.  I'm leaving the 23rd to go.     Dr. Rollene prescribed the cough medicine.   I've been tired lately.   2. SEVERITY: How bad is the cough today?      It's bad at night mostly.    Do I need an antibiotic.     3. SPUTUM: Describe the color of your sputum (e.g., none, dry cough; clear, white, yellow, green)     3 days ago I was coughing up mucus but not now.   I got sick 5 days ago.    4. HEMOPTYSIS: Are you coughing up any blood? If Yes, ask: How much? (e.g., flecks, streaks, tablespoons, etc.)     Not asked 5. DIFFICULTY BREATHING: Are you having difficulty breathing? If Yes, ask: How bad is it?  (e.g., mild, moderate, severe)      No shortness of breath 6. FEVER: Do you have a fever? If Yes, ask: What is your temperature, how was it measured, and when did it start?     No  7. CARDIAC HISTORY: Do you have any history of heart disease? (e.g., heart attack, congestive heart failure)      No 8. LUNG HISTORY: Do you have any history of lung disease?  (e.g., pulmonary embolus, asthma, emphysema)     No 9. PE RISK FACTORS: Do you have a history of blood clots? (or: recent major surgery, recent prolonged travel, bedridden)     Not asked 10. OTHER SYMPTOMS: Do you have any other symptoms? (e.g., runny nose, wheezing, chest pain)       I'm tired 11. PREGNANCY: Is there any chance you are pregnant? When was your last menstrual period?       N/A 12. TRAVEL: Have you traveled out of the country in the last month? (e.g., travel history, exposures)       N/A  Protocols used: Cough - Acute Productive-A-AH

## 2024-08-04 NOTE — Telephone Encounter (Signed)
 Pt. States he has a My Chart message to call and make an appointment, appointment made.

## 2024-08-06 ENCOUNTER — Ambulatory Visit (INDEPENDENT_AMBULATORY_CARE_PROVIDER_SITE_OTHER): Admitting: Nurse Practitioner

## 2024-08-06 VITALS — BP 132/84 | HR 61 | Temp 98.0°F | Ht 65.0 in | Wt 210.4 lb

## 2024-08-06 DIAGNOSIS — R052 Subacute cough: Secondary | ICD-10-CM | POA: Diagnosis not present

## 2024-08-06 NOTE — Progress Notes (Signed)
   Established Patient Office Visit  Subjective   Patient ID: Leonard Soto, male    DOB: 1956/06/09  Age: 68 y.o. MRN: [id number]  Chief Complaint  Patient presents with   Cough    Discussed the use of AI scribe software for clinical note transcription with the patient, who gave verbal consent to proceed.  History of Present Illness Leonard Soto is a 68 year old male who presents with a persistent cough lasting over a month. He was initially seen by Dr. Rollene on August 18th for this issue.  Cough - Persistent dry cough for over one month - Initially productive with phlegm, now non-productive - Cough primarily occurs when lying down - No fever, night sweats, or chills - No shortness of breath - Symptoms with over-the-counter Robitussin.  Patient was prescribed promethazine  dextromethorphan as well, this causes significant sedation per his report.  Respiratory history - No history of asthma or COPD - No tobacco use since age 51       ROS: see HPI    Objective:     BP 132/84   Pulse 61   Temp 98 F (36.7 C) (Temporal)   Ht 5' 5 (1.651 m)   Wt 210 lb 6 oz (95.4 kg)   SpO2 97%   BMI 35.01 kg/m    Physical Exam Vitals reviewed.  Constitutional:      Appearance: Normal appearance.  HENT:     Head: Normocephalic and atraumatic.  Cardiovascular:     Rate and Rhythm: Normal rate and regular rhythm.  Pulmonary:     Effort: Pulmonary effort is normal.     Breath sounds: Normal breath sounds.  Musculoskeletal:     Cervical back: Neck supple.  Skin:    General: Skin is warm and dry.  Neurological:     Mental Status: He is alert and oriented to person, place, and time.  Psychiatric:        Mood and Affect: Mood normal.        Behavior: Behavior normal.        Thought Content: Thought content normal.        Judgment: Judgment normal.      No results found for any visits on 08/06/24.    The 10-year ASCVD risk score (Arnett DK, et al., 2019)  is: 18.4%    Assessment & Plan:   Problem List Items Addressed This Visit       Other   Subacute cough - Primary   Post-viral cough Persistent dry cough likely post-viral, improving slowly, unlikely bacterial infection. -No evidence of pneumonia on clinical exam today - Discussed risk versus benefits of antibiotic use, per shared decision making we will hold off on antibiotics at this time. - Recommend continued use of over-the-counter Robitussin and cough drops. - Advise follow-up if symptoms persist beyond six weeks for potential chest x-ray.      Assessment and Plan Assessment & Plan Post-viral cough Persistent dry cough likely post-viral, improving slowly, unlikely bacterial infection. -No evidence of pneumonia on clinical exam today - Discussed risk versus benefits of antibiotic use, per shared decision making we will hold off on antibiotics at this time. - Recommend continued use of over-the-counter Robitussin and cough drops. - Advise follow-up if symptoms persist beyond six weeks for potential chest x-ray.   Return if symptoms worsen or fail to improve.    Lauraine FORBES Pereyra, NP

## 2024-08-06 NOTE — Assessment & Plan Note (Signed)
 Post-viral cough Persistent dry cough likely post-viral, improving slowly, unlikely bacterial infection. -No evidence of pneumonia on clinical exam today - Discussed risk versus benefits of antibiotic use, per shared decision making we will hold off on antibiotics at this time. - Recommend continued use of over-the-counter Robitussin and cough drops. - Advise follow-up if symptoms persist beyond six weeks for potential chest x-ray.

## 2024-11-04 ENCOUNTER — Telehealth: Payer: Self-pay

## 2024-11-04 DIAGNOSIS — E782 Mixed hyperlipidemia: Secondary | ICD-10-CM

## 2024-11-04 NOTE — Progress Notes (Signed)
 Pharmacy Quality Measure Review  This patient is appearing on a report for being at risk of failing the adherence measure for cholesterol (statin) medications this calendar year.   Medication: Atorvastatin  20mg  Last fill date: 08/18 for 90 day supply  Attempted call x2, unable to reach patient. Will send MyChart message regarding need to check in on atorvastatin  adherence.  Bernese Doffing, PharmD [facility] Pharmacist

## 2024-11-08 ENCOUNTER — Ambulatory Visit

## 2024-11-08 DIAGNOSIS — Z23 Encounter for immunization: Secondary | ICD-10-CM

## 2024-11-08 NOTE — Progress Notes (Signed)
 After obtaining consent, and per orders of Dr. Rollene, injection of HDFLU given by Ronnald SHAUNNA Palms. Patient instructed to report any adverse reaction to me immediately.

## 2025-04-14 ENCOUNTER — Ambulatory Visit
